# Patient Record
Sex: Female | Born: 1950 | Race: White | Hispanic: No | Marital: Married | Smoking: Current every day smoker
Health system: Southern US, Community
[De-identification: ages and names within clinical notes are randomized; demographics above are authoritative.]

## PROBLEM LIST (undated history)

## (undated) DIAGNOSIS — E119 Type 2 diabetes mellitus without complications: Secondary | ICD-10-CM

## (undated) DIAGNOSIS — J449 Chronic obstructive pulmonary disease, unspecified: Secondary | ICD-10-CM

## (undated) DIAGNOSIS — I10 Essential (primary) hypertension: Secondary | ICD-10-CM

---

## 2016-02-01 DIAGNOSIS — D472 Monoclonal gammopathy: Secondary | ICD-10-CM

## 2016-05-08 DIAGNOSIS — D472 Monoclonal gammopathy: Secondary | ICD-10-CM

## 2016-05-08 DIAGNOSIS — Z803 Family history of malignant neoplasm of breast: Secondary | ICD-10-CM

## 2016-05-08 DIAGNOSIS — F17210 Nicotine dependence, cigarettes, uncomplicated: Secondary | ICD-10-CM

## 2016-11-07 DIAGNOSIS — D472 Monoclonal gammopathy: Secondary | ICD-10-CM | POA: Diagnosis not present

## 2016-11-07 DIAGNOSIS — F17219 Nicotine dependence, cigarettes, with unspecified nicotine-induced disorders: Secondary | ICD-10-CM | POA: Diagnosis not present

## 2016-11-07 DIAGNOSIS — Z803 Family history of malignant neoplasm of breast: Secondary | ICD-10-CM

## 2017-05-12 DIAGNOSIS — D472 Monoclonal gammopathy: Secondary | ICD-10-CM | POA: Diagnosis not present

## 2017-05-12 DIAGNOSIS — H6693 Otitis media, unspecified, bilateral: Secondary | ICD-10-CM | POA: Diagnosis not present

## 2017-05-12 DIAGNOSIS — Z803 Family history of malignant neoplasm of breast: Secondary | ICD-10-CM | POA: Diagnosis not present

## 2018-04-20 DIAGNOSIS — R590 Localized enlarged lymph nodes: Secondary | ICD-10-CM

## 2018-04-20 DIAGNOSIS — I10 Essential (primary) hypertension: Secondary | ICD-10-CM

## 2018-04-20 DIAGNOSIS — R0602 Shortness of breath: Secondary | ICD-10-CM | POA: Diagnosis not present

## 2018-04-20 DIAGNOSIS — R55 Syncope and collapse: Secondary | ICD-10-CM

## 2018-04-20 DIAGNOSIS — R918 Other nonspecific abnormal finding of lung field: Secondary | ICD-10-CM

## 2018-04-20 DIAGNOSIS — R441 Visual hallucinations: Secondary | ICD-10-CM | POA: Diagnosis not present

## 2018-04-20 DIAGNOSIS — E876 Hypokalemia: Secondary | ICD-10-CM

## 2018-04-20 DIAGNOSIS — N179 Acute kidney failure, unspecified: Secondary | ICD-10-CM

## 2018-04-21 ENCOUNTER — Other Ambulatory Visit: Payer: Self-pay

## 2018-04-21 ENCOUNTER — Encounter (HOSPITAL_COMMUNITY): Payer: Self-pay | Admitting: Family Medicine

## 2018-04-21 ENCOUNTER — Inpatient Hospital Stay (HOSPITAL_COMMUNITY)
Admission: AD | Admit: 2018-04-21 | Discharge: 2018-04-23 | DRG: 180 | Disposition: A | Discharge status: Home / Self Care | Payer: Medicare Other | Source: Other Acute Inpatient Hospital | Attending: Family Medicine | Admitting: Family Medicine

## 2018-04-21 DIAGNOSIS — E119 Type 2 diabetes mellitus without complications: Secondary | ICD-10-CM | POA: Diagnosis not present

## 2018-04-21 DIAGNOSIS — C3411 Malignant neoplasm of upper lobe, right bronchus or lung: Secondary | ICD-10-CM | POA: Diagnosis present

## 2018-04-21 DIAGNOSIS — Z7984 Long term (current) use of oral hypoglycemic drugs: Secondary | ICD-10-CM

## 2018-04-21 DIAGNOSIS — F419 Anxiety disorder, unspecified: Secondary | ICD-10-CM | POA: Diagnosis present

## 2018-04-21 DIAGNOSIS — E1151 Type 2 diabetes mellitus with diabetic peripheral angiopathy without gangrene: Secondary | ICD-10-CM | POA: Diagnosis present

## 2018-04-21 DIAGNOSIS — E876 Hypokalemia: Secondary | ICD-10-CM | POA: Diagnosis present

## 2018-04-21 DIAGNOSIS — E279 Disorder of adrenal gland, unspecified: Secondary | ICD-10-CM

## 2018-04-21 DIAGNOSIS — R590 Localized enlarged lymph nodes: Secondary | ICD-10-CM | POA: Diagnosis not present

## 2018-04-21 DIAGNOSIS — F17210 Nicotine dependence, cigarettes, uncomplicated: Secondary | ICD-10-CM | POA: Diagnosis present

## 2018-04-21 DIAGNOSIS — Z803 Family history of malignant neoplasm of breast: Secondary | ICD-10-CM

## 2018-04-21 DIAGNOSIS — N179 Acute kidney failure, unspecified: Secondary | ICD-10-CM | POA: Diagnosis present

## 2018-04-21 DIAGNOSIS — I129 Hypertensive chronic kidney disease with stage 1 through stage 4 chronic kidney disease, or unspecified chronic kidney disease: Secondary | ICD-10-CM | POA: Diagnosis present

## 2018-04-21 DIAGNOSIS — Z7982 Long term (current) use of aspirin: Secondary | ICD-10-CM

## 2018-04-21 DIAGNOSIS — R441 Visual hallucinations: Secondary | ICD-10-CM | POA: Diagnosis not present

## 2018-04-21 DIAGNOSIS — R0602 Shortness of breath: Secondary | ICD-10-CM | POA: Diagnosis not present

## 2018-04-21 DIAGNOSIS — E1122 Type 2 diabetes mellitus with diabetic chronic kidney disease: Secondary | ICD-10-CM | POA: Diagnosis present

## 2018-04-21 DIAGNOSIS — Z79899 Other long term (current) drug therapy: Secondary | ICD-10-CM | POA: Diagnosis not present

## 2018-04-21 DIAGNOSIS — Z91030 Bee allergy status: Secondary | ICD-10-CM | POA: Diagnosis not present

## 2018-04-21 DIAGNOSIS — N183 Chronic kidney disease, stage 3 (moderate): Secondary | ICD-10-CM | POA: Diagnosis present

## 2018-04-21 DIAGNOSIS — C3491 Malignant neoplasm of unspecified part of right bronchus or lung: Secondary | ICD-10-CM | POA: Diagnosis present

## 2018-04-21 DIAGNOSIS — F329 Major depressive disorder, single episode, unspecified: Secondary | ICD-10-CM | POA: Diagnosis present

## 2018-04-21 DIAGNOSIS — I10 Essential (primary) hypertension: Secondary | ICD-10-CM | POA: Diagnosis present

## 2018-04-21 DIAGNOSIS — Z800 Family history of malignant neoplasm of digestive organs: Secondary | ICD-10-CM

## 2018-04-21 DIAGNOSIS — J9601 Acute respiratory failure with hypoxia: Secondary | ICD-10-CM | POA: Diagnosis present

## 2018-04-21 DIAGNOSIS — Z880 Allergy status to penicillin: Secondary | ICD-10-CM

## 2018-04-21 DIAGNOSIS — J441 Chronic obstructive pulmonary disease with (acute) exacerbation: Secondary | ICD-10-CM | POA: Diagnosis present

## 2018-04-21 DIAGNOSIS — R918 Other nonspecific abnormal finding of lung field: Secondary | ICD-10-CM | POA: Diagnosis present

## 2018-04-21 DIAGNOSIS — E278 Other specified disorders of adrenal gland: Secondary | ICD-10-CM | POA: Diagnosis present

## 2018-04-21 DIAGNOSIS — R55 Syncope and collapse: Secondary | ICD-10-CM | POA: Diagnosis not present

## 2018-04-21 DIAGNOSIS — Z716 Tobacco abuse counseling: Secondary | ICD-10-CM

## 2018-04-21 HISTORY — DX: Essential (primary) hypertension: I10

## 2018-04-21 HISTORY — DX: Type 2 diabetes mellitus without complications: E11.9

## 2018-04-21 HISTORY — DX: Chronic obstructive pulmonary disease, unspecified: J44.9

## 2018-04-21 LAB — COMPREHENSIVE METABOLIC PANEL
ALBUMIN: 3.4 g/dL — AB (ref 3.5–5.0)
ALK PHOS: 56 U/L (ref 38–126)
ALT: 19 U/L (ref 0–44)
ANION GAP: 11 (ref 5–15)
AST: 15 U/L (ref 15–41)
BILIRUBIN TOTAL: 0.5 mg/dL (ref 0.3–1.2)
BUN: 23 mg/dL (ref 8–23)
CALCIUM: 10.4 mg/dL — AB (ref 8.9–10.3)
CO2: 22 mmol/L (ref 22–32)
Chloride: 108 mmol/L (ref 98–111)
Creatinine, Ser: 1.07 mg/dL — ABNORMAL HIGH (ref 0.44–1.00)
GFR calc non Af Amer: 52 mL/min — ABNORMAL LOW (ref >60)
GLUCOSE: 112 mg/dL — AB (ref 70–99)
POTASSIUM: 4.6 mmol/L (ref 3.5–5.1)
SODIUM: 141 mmol/L (ref 135–145)
TOTAL PROTEIN: 8.1 g/dL (ref 6.5–8.1)

## 2018-04-21 LAB — CBC WITH DIFFERENTIAL/PLATELET
Abs Immature Granulocytes: 0.1 10*3/uL (ref 0.0–0.1)
BASOS ABS: 0 10*3/uL (ref 0.0–0.1)
BASOS PCT: 0 %
Eosinophils Absolute: 0 10*3/uL (ref 0.0–0.7)
Eosinophils Relative: 0 %
HCT: 39.6 % (ref 36.0–46.0)
HEMOGLOBIN: 12.4 g/dL (ref 12.0–15.0)
IMMATURE GRANULOCYTES: 1 %
LYMPHS PCT: 9 %
Lymphs Abs: 0.9 10*3/uL (ref 0.7–4.0)
MCH: 27.2 pg (ref 26.0–34.0)
MCHC: 31.3 g/dL (ref 30.0–36.0)
MCV: 86.8 fL (ref 78.0–100.0)
MONO ABS: 0.8 10*3/uL (ref 0.1–1.0)
Monocytes Relative: 9 %
NEUTROS PCT: 81 %
Neutro Abs: 7.7 10*3/uL (ref 1.7–7.7)
PLATELETS: 324 10*3/uL (ref 150–400)
RBC: 4.56 MIL/uL (ref 3.87–5.11)
RDW: 15.5 % (ref 11.5–15.5)
WBC: 9.4 10*3/uL (ref 4.0–10.5)

## 2018-04-21 LAB — GLUCOSE, CAPILLARY
GLUCOSE-CAPILLARY: 100 mg/dL — AB (ref 70–99)
Glucose-Capillary: 115 mg/dL — ABNORMAL HIGH (ref 70–99)

## 2018-04-21 MED ORDER — ROSUVASTATIN CALCIUM 10 MG PO TABS
40.0000 mg | ORAL_TABLET | Freq: Every day | ORAL | Status: DC
  Administered 2018-04-21 – 2018-04-22 (×2): 40 mg via ORAL
  Filled 2018-04-21 (×2): qty 4

## 2018-04-21 MED ORDER — ALBUTEROL SULFATE (2.5 MG/3ML) 0.083% IN NEBU: 2.5000 mg | INHALATION_SOLUTION | RESPIRATORY_TRACT | Status: DC | PRN

## 2018-04-21 MED ORDER — ONDANSETRON HCL 4 MG PO TABS: 4.0000 mg | ORAL_TABLET | Freq: Four times a day (QID) | ORAL | Status: DC | PRN

## 2018-04-21 MED ORDER — MIRTAZAPINE 15 MG PO TABS
15.0000 mg | ORAL_TABLET | Freq: Every day | ORAL | Status: DC
  Administered 2018-04-21 – 2018-04-22 (×2): 15 mg via ORAL
  Filled 2018-04-21 (×2): qty 1

## 2018-04-21 MED ORDER — IPRATROPIUM-ALBUTEROL 0.5-2.5 (3) MG/3ML IN SOLN
3.0000 mL | Freq: Three times a day (TID) | RESPIRATORY_TRACT | Status: DC
  Administered 2018-04-21 – 2018-04-23 (×5): 3 mL via RESPIRATORY_TRACT
  Filled 2018-04-21 (×5): qty 3

## 2018-04-21 MED ORDER — HYDRALAZINE HCL 20 MG/ML IJ SOLN: 10.0000 mg | INTRAMUSCULAR | Status: DC | PRN

## 2018-04-21 MED ORDER — SODIUM CHLORIDE 0.9% FLUSH
3.0000 mL | Freq: Two times a day (BID) | INTRAVENOUS | Status: DC
  Administered 2018-04-21 – 2018-04-23 (×4): 3 mL via INTRAVENOUS

## 2018-04-21 MED ORDER — PREDNISONE 20 MG PO TABS
50.0000 mg | ORAL_TABLET | Freq: Every day | ORAL | Status: DC
  Administered 2018-04-22 – 2018-04-23 (×2): 50 mg via ORAL
  Filled 2018-04-21 (×2): qty 2

## 2018-04-21 MED ORDER — SODIUM CHLORIDE 0.9 % IV SOLN
INTRAVENOUS | Status: AC
  Administered 2018-04-21: 22:00:00 via INTRAVENOUS

## 2018-04-21 MED ORDER — ACETAMINOPHEN 325 MG PO TABS: 650.0000 mg | ORAL_TABLET | Freq: Four times a day (QID) | ORAL | Status: DC | PRN

## 2018-04-21 MED ORDER — INSULIN ASPART 100 UNIT/ML ~~LOC~~ SOLN
0.0000 [IU] | SUBCUTANEOUS | Status: DC
  Administered 2018-04-22 – 2018-04-23 (×4): 2 [IU] via SUBCUTANEOUS

## 2018-04-21 MED ORDER — MOMETASONE FURO-FORMOTEROL FUM 100-5 MCG/ACT IN AERO
2.0000 | INHALATION_SPRAY | Freq: Two times a day (BID) | RESPIRATORY_TRACT | Status: DC
  Administered 2018-04-22 – 2018-04-23 (×3): 2 via RESPIRATORY_TRACT
  Filled 2018-04-21: qty 8.8

## 2018-04-21 MED ORDER — BISACODYL 5 MG PO TBEC: 5.0000 mg | DELAYED_RELEASE_TABLET | Freq: Every day | ORAL | Status: DC | PRN

## 2018-04-21 MED ORDER — SENNOSIDES-DOCUSATE SODIUM 8.6-50 MG PO TABS: 1.0000 | ORAL_TABLET | Freq: Every evening | ORAL | Status: DC | PRN

## 2018-04-21 MED ORDER — HYDROCODONE-ACETAMINOPHEN 5-325 MG PO TABS: 1.0000 | ORAL_TABLET | ORAL | Status: DC | PRN

## 2018-04-21 MED ORDER — LEVOFLOXACIN IN D5W 750 MG/150ML IV SOLN
750.0000 mg | INTRAVENOUS | Status: DC
  Administered 2018-04-22 – 2018-04-23 (×2): 750 mg via INTRAVENOUS
  Filled 2018-04-21 (×2): qty 150

## 2018-04-21 MED ORDER — METOPROLOL SUCCINATE ER 50 MG PO TB24
50.0000 mg | ORAL_TABLET | Freq: Every day | ORAL | Status: DC
  Administered 2018-04-22 – 2018-04-23 (×2): 50 mg via ORAL
  Filled 2018-04-21 (×2): qty 1

## 2018-04-21 MED ORDER — GUAIFENESIN ER 600 MG PO TB12
600.0000 mg | ORAL_TABLET | Freq: Two times a day (BID) | ORAL | Status: DC | PRN
  Administered 2018-04-22: 600 mg via ORAL
  Filled 2018-04-21: qty 1

## 2018-04-21 MED ORDER — ACETAMINOPHEN 650 MG RE SUPP: 650.0000 mg | Freq: Four times a day (QID) | RECTAL | Status: DC | PRN

## 2018-04-21 MED ORDER — GABAPENTIN 300 MG PO CAPS
300.0000 mg | ORAL_CAPSULE | Freq: Every day | ORAL | Status: DC
  Administered 2018-04-21 – 2018-04-22 (×2): 300 mg via ORAL
  Filled 2018-04-21 (×2): qty 1

## 2018-04-21 MED ORDER — ONDANSETRON HCL 4 MG/2ML IJ SOLN: 4.0000 mg | Freq: Four times a day (QID) | INTRAMUSCULAR | Status: DC | PRN

## 2018-04-21 MED ORDER — CITALOPRAM HYDROBROMIDE 20 MG PO TABS
40.0000 mg | ORAL_TABLET | Freq: Every evening | ORAL | Status: DC
  Administered 2018-04-21 – 2018-04-22 (×2): 40 mg via ORAL
  Filled 2018-04-21 (×2): qty 2

## 2018-04-21 NOTE — Progress Notes (Addendum)
Pharmacy Antibiotic Note  Alicia Morse is a 67 y.o. female admitted on 04/21/2018 with a right lung mass with plans to consult CT surgery for VATS. Also with concern for CAP and pharmacy has been consulted for Levaquin dosing.  The patient is noted to have anaphylaxis with penicillins and there is no record of cephalosporin use in our system. It is documented that the patient received a dose of Levaquin at [facility] however there is no MAR in the paper records to indicate when this dose was given. It is presumed that the dose was likely given within the first few hours of arrival to [place] so will estimate a time between 0900-1200.   Plan: - CMP was ordered upon arrival to [facility] and remains pending - Will check out to 3rd shift to follow-up on this result for determination of Levaquin dose moving forward.    Temp (24hrs), Avg:97.6 F (36.4 C), Min:97.6 F (36.4 C), Max:97.6 F (36.4 C)  Recent Labs  Lab 04/21/18 2153  WBC 9.4  CREATININE 1.07*       Allergies  Allergen Reactions  . Penicillins Anaphylaxis    Antimicrobials this admission: LVQ 9/24 >>    Thank you for allowing pharmacy to be a part of this patient's care.  Alycia Rossetti, PharmD, BCPS Clinical Pharmacist Pager: [phone] Please check AMION for all [place] numbers 04/21/2018 10:27 PM     Addendum: SCr 1.07.  Spoke w/ [facility] pharmacist who reports that pt rec'd Levaquin 750mg  IV 9/23 ~1230 then Levaquin 750mg  PO 9/24 ~1230.  Will continue with Levaquin 750mg  IV Q24H. Wynona Neat, PharmD, BCPS 04/21/2018 11:37 PM

## 2018-04-21 NOTE — H&P (Signed)
History and Physical    Alicia Morse VZD:[id number] DOB: 1951-05-18 DOA: 04/21/2018  PCP: [name]., MD   Patient coming from: Home, by way of [facility]   Chief Complaint: SOB, productive cough  HPI: Alicia Morse is a 67 y.o. female with medical history significant for COPD, type 2 diabetes mellitus, hypertension, and anxiety, who presented to the emergency department for evaluation of progressive shortness of breath and productive cough despite completing a course of azithromycin last week.  Patient reports a chronic mild cough that worsened a few weeks ago and became productive of thick sputum.  She reports some occasional chills and reports a long history of night sweats, but denies recent weight loss.  She previously smoked 2 packs/day but has more recently cut back to ~1/4 ppd.  She denies chest pain or hemoptysis.  She had one episode of posttussive vomiting, but no hematemesis and no diarrhea.  [facility] ED & [facility] Course: Upon arrival to the ED, patient is found to be afebrile with cough and wheeze, saturating 80% on room air.  Creatinine was elevated at 1.60, up from her reported baseline of 1.0.  D-dimer was elevated.  CTA chest was negative for PE, but concerning for right upper lobe mass with thoracic LAD and possible adrenal mets.  She was treated with steroids, Levaquin, and nebs.  She had a syncopal episode in the setting of a coughing fit and quickly recovered.  There was no associated chest pain or palpitations.  Oncology recommended biopsy ASAP.  IR recommended consulting cardiothoracic surgery to see.  [facility] surgery at [facility] was consulted and agreed with transfer and the patient here with plan for VATS on 04/22/18.    Review of Systems:  All other systems reviewed and apart from HPI, are negative.  Past Medical History:  Diagnosis Date  . COPD (chronic obstructive pulmonary disease) ([place])   . Diabetes mellitus without complication ([place])   .  Hypertension     History reviewed. No pertinent surgical history.   reports that she has been smoking. She has never used smokeless tobacco. She reports that she drank alcohol. She reports that she does not use drugs.  Allergies  Allergen Reactions  . Penicillins Anaphylaxis    Family History  Problem Relation Age of Onset  . Breast cancer Mother   . Colon cancer Father      Prior to Admission medications   Medication Sig Start Date End Date Taking? Authorizing Provider  aspirin 325 MG tablet Take 325 mg by mouth daily.    [provider]  citalopram (CELEXA) 40 MG tablet Take 40 mg by mouth every evening. 02/04/18   [provider]  gabapentin (NEURONTIN) 300 MG capsule Take 300 mg by mouth at bedtime.    [provider]  losartan (COZAAR) 100 MG tablet Take 100 mg by mouth daily. 02/04/18   [provider]  metFORMIN (GLUCOPHAGE-XR) 500 MG 24 hr tablet Take 500 mg by mouth 2 (two) times daily. 03/15/18   [provider]  metoprolol succinate (TOPROL-XL) 50 MG 24 hr tablet Take 50 mg by mouth daily. 02/11/18   [provider]  mirtazapine (REMERON) 15 MG tablet Take 15 mg by mouth at bedtime.    [provider]  rosuvastatin (CRESTOR) 40 MG tablet Take 40 mg by mouth every evening. 02/04/18   [provider]  senna (SENOKOT) 8.6 MG tablet Take 3 tablets by mouth every evening.    [provider]  triamterene-hydrochlorothiazide (DYAZIDE) 37.5-25 MG capsule Take 1 capsule by mouth daily. 02/27/18   [provider]    Physical Exam: Vitals:   04/21/18 2023  BP: (!) 183/91  Pulse: 86  Resp: (!) 21  Temp: 97.6 F (36.4 C)  TempSrc: Oral  SpO2: 99%     Constitutional: NAD, calm  Eyes: PERTLA, lids and conjunctivae normal ENMT: Mucous membranes are moist. Posterior pharynx clear of any exudate or lesions.   Neck: normal, supple, no masses, no thyromegaly Respiratory: Diminished breath  sounds bilaterally. Mild dyspnea with speech. No accessory muscle use.  Cardiovascular: S1 & S2 heard, regular rate and rhythm. No extremity edema.   Abdomen: No distension, no tenderness, soft. Bowel sounds normal.  Musculoskeletal: no clubbing / cyanosis. Status-post BKA.    Skin: no significant rashes, lesions, ulcers. Warm, dry, well-perfused. Neurologic: CN 2-12 grossly intact. Sensation intact. Strength 5/5 in all 4 limbs.  Psychiatric: Alert and oriented x 3. Pleasant and cooperative.    Labs on Admission: I have personally reviewed following labs and imaging studies  CBC: No results for input(s): WBC, NEUTROABS, HGB, HCT, MCV, PLT in the last 168 hours. Basic Metabolic Panel: No results for input(s): NA, K, CL, CO2, GLUCOSE, BUN, CREATININE, CALCIUM, MG, PHOS in the last 168 hours. GFR: CrCl cannot be calculated (No successful lab value found.). Liver Function Tests: No results for input(s): AST, ALT, ALKPHOS, BILITOT, PROT, ALBUMIN in the last 168 hours. No results for input(s): LIPASE, AMYLASE in the last 168 hours. No results for input(s): AMMONIA in the last 168 hours. Coagulation Profile: No results for input(s): INR, PROTIME in the last 168 hours. Cardiac Enzymes: No results for input(s): CKTOTAL, CKMB, CKMBINDEX, TROPONINI in the last 168 hours. BNP (last 3 results) No results for input(s): PROBNP in the last 8760 hours. HbA1C: No results for input(s): HGBA1C in the last 72 hours. CBG: No results for input(s): GLUCAP in the last 168 hours. Lipid Profile: No results for input(s): CHOL, HDL, LDLCALC, TRIG, CHOLHDL, LDLDIRECT in the last 72 hours. Thyroid Function Tests: No results for input(s): TSH, T4TOTAL, FREET4, T3FREE, THYROIDAB in the last 72 hours. Anemia Panel: No results for input(s): VITAMINB12, FOLATE, FERRITIN, TIBC, IRON, RETICCTPCT in the last 72 hours. Urine analysis: No results found for: COLORURINE, APPEARANCEUR, LABSPEC, PHURINE, GLUCOSEU, HGBUR,  BILIRUBINUR, KETONESUR, PROTEINUR, UROBILINOGEN, NITRITE, LEUKOCYTESUR Sepsis Labs: @LABRCNTIP (procalcitonin:4,lacticidven:4) )No results found for this or any previous visit (from the past 240 hour(s)).   Radiological Exams on Admission: No results found.  EKG: Not performed.   Assessment/Plan   1. Right lung mass - Presents with progressive SOB and productive cough despite completing a course of azithromycin last week  - CTA was negative for PE but concerning for right lung mass with adenopathy and adrenal masses  - Oncology was consulted from [facility] and recommended obtaining tissue sample ASAP  - CT surgery is consulting and much appreciated, planning tentatively for VATS on 04/22/18  - Keep NPO after midnight, continue supportive care    2. COPD with acute exacerbation  - Presents with SOB and productive cough that worsened despite recent treatment with azithromycin  - She was started on prednisone and Levaquin at [facility] and nebs were continued  - Continue ICS/LABA, prednisone, nebs, Levaquin, as-needed supplemental O2    3. AKI  - SCr was 1.6 at [facility], up from reported baseline of 1.0  - She was treated with IVF  - Hold ARB and diuretics, renally-dose medications, repeat chem  panel   4. Syncope  - Patient had a syncopal episode on 04/21/18  - This was in the setting of a coughing fit and likely vasovagal  - Continue cardiac monitoring for now    5. Hypertension  - Hold diuretics and ARB in light of AKI  - Continue Toprol as tolerated    6. Type II DM  - A1c was 6.6% this month  - Managed with metformin at home, held on admission  - Check CBG's and use a low-intensity SSI with Novolog while in hospital   7. Anxiety  - Cotinue Celexa and Remeron    8. Hypokalemia  - Serum potassium was 3.3 and treated with 40 mEq oral potassium  - Repeat chem panel     DVT prophylaxis: SCD's  Code Status: Full  Family Communication: Discussed with patient    Consults called: CTS Admission status: Inpatient     Vianne Bulls, MD [facility] Pager [phone]  If 7PM-7AM, please contact night-coverage [email or web address] Password [facility]  04/21/2018, 9:26 PM

## 2018-04-22 ENCOUNTER — Encounter (HOSPITAL_COMMUNITY)
Admission: AD | Disposition: A | Discharge status: Home / Self Care | Payer: Self-pay | Source: Other Acute Inpatient Hospital | Attending: Family Medicine

## 2018-04-22 ENCOUNTER — Inpatient Hospital Stay (HOSPITAL_COMMUNITY): Payer: Medicare Other | Admitting: Anesthesiology

## 2018-04-22 ENCOUNTER — Inpatient Hospital Stay (HOSPITAL_COMMUNITY): Payer: Medicare Other

## 2018-04-22 ENCOUNTER — Encounter (HOSPITAL_COMMUNITY): Payer: Self-pay | Admitting: Anesthesiology

## 2018-04-22 ENCOUNTER — Ambulatory Visit (HOSPITAL_COMMUNITY): Admission: RE | Admit: 2018-04-22 | Payer: Medicare Other | Source: Ambulatory Visit | Admitting: Cardiothoracic Surgery

## 2018-04-22 DIAGNOSIS — R590 Localized enlarged lymph nodes: Secondary | ICD-10-CM

## 2018-04-22 DIAGNOSIS — I10 Essential (primary) hypertension: Secondary | ICD-10-CM

## 2018-04-22 DIAGNOSIS — R918 Other nonspecific abnormal finding of lung field: Secondary | ICD-10-CM

## 2018-04-22 DIAGNOSIS — E119 Type 2 diabetes mellitus without complications: Secondary | ICD-10-CM

## 2018-04-22 HISTORY — PX: VIDEO BRONCHOSCOPY WITH ENDOBRONCHIAL ULTRASOUND: SHX6177

## 2018-04-22 LAB — GLUCOSE, CAPILLARY
GLUCOSE-CAPILLARY: 114 mg/dL — AB (ref 70–99)
GLUCOSE-CAPILLARY: 142 mg/dL — AB (ref 70–99)
GLUCOSE-CAPILLARY: 162 mg/dL — AB (ref 70–99)
GLUCOSE-CAPILLARY: 200 mg/dL — AB (ref 70–99)
GLUCOSE-CAPILLARY: 95 mg/dL (ref 70–99)
Glucose-Capillary: 167 mg/dL — ABNORMAL HIGH (ref 70–99)
Glucose-Capillary: 196 mg/dL — ABNORMAL HIGH (ref 70–99)

## 2018-04-22 LAB — COMPREHENSIVE METABOLIC PANEL
ALBUMIN: 3.3 g/dL — AB (ref 3.5–5.0)
ALK PHOS: 51 U/L (ref 38–126)
ALT: 17 U/L (ref 0–44)
ANION GAP: 14 (ref 5–15)
AST: 16 U/L (ref 15–41)
BILIRUBIN TOTAL: 0.3 mg/dL (ref 0.3–1.2)
BUN: 19 mg/dL (ref 8–23)
CALCIUM: 10 mg/dL (ref 8.9–10.3)
CO2: 19 mmol/L — ABNORMAL LOW (ref 22–32)
CREATININE: 0.99 mg/dL (ref 0.44–1.00)
Chloride: 107 mmol/L (ref 98–111)
GFR calc Af Amer: >60 mL/min (ref >60)
GFR calc non Af Amer: 58 mL/min — ABNORMAL LOW (ref >60)
GLUCOSE: 184 mg/dL — AB (ref 70–99)
Potassium: 3.8 mmol/L (ref 3.5–5.1)
Sodium: 140 mmol/L (ref 135–145)
TOTAL PROTEIN: 7.9 g/dL (ref 6.5–8.1)

## 2018-04-22 LAB — TYPE AND SCREEN
ABO/RH(D): A NEG
Antibody Screen: NEGATIVE

## 2018-04-22 LAB — CBC
HCT: 41.7 % (ref 36.0–46.0)
Hemoglobin: 13.3 g/dL (ref 12.0–15.0)
MCH: 27.3 pg (ref 26.0–34.0)
MCHC: 31.9 g/dL (ref 30.0–36.0)
MCV: 85.6 fL (ref 78.0–100.0)
PLATELETS: 317 10*3/uL (ref 150–400)
RBC: 4.87 MIL/uL (ref 3.87–5.11)
RDW: 15.6 % — AB (ref 11.5–15.5)
WBC: 10.2 10*3/uL (ref 4.0–10.5)

## 2018-04-22 LAB — ABO/RH: ABO/RH(D): A NEG

## 2018-04-22 LAB — MRSA PCR SCREENING: MRSA by PCR: NEGATIVE

## 2018-04-22 LAB — PROTIME-INR
INR: 1.19
PROTHROMBIN TIME: 15 s (ref 11.4–15.2)

## 2018-04-22 LAB — APTT: aPTT: 31 seconds (ref 24–36)

## 2018-04-22 LAB — HIV ANTIBODY (ROUTINE TESTING W REFLEX): HIV SCREEN 4TH GENERATION: NONREACTIVE

## 2018-04-22 SURGERY — BRONCHOSCOPY, WITH EBUS
Anesthesia: General | Site: Bronchus

## 2018-04-22 MED ORDER — ONDANSETRON HCL 4 MG/2ML IJ SOLN
INTRAMUSCULAR | Status: DC | PRN
  Administered 2018-04-22: 4 mg via INTRAVENOUS

## 2018-04-22 MED ORDER — HYDROXYZINE HCL 25 MG PO TABS
25.0000 mg | ORAL_TABLET | Freq: Once | ORAL | Status: AC
  Administered 2018-04-22: 25 mg via ORAL
  Filled 2018-04-22: qty 1

## 2018-04-22 MED ORDER — DEXAMETHASONE SODIUM PHOSPHATE 10 MG/ML IJ SOLN
INTRAMUSCULAR | Status: AC
  Filled 2018-04-22: qty 1

## 2018-04-22 MED ORDER — PROMETHAZINE HCL 25 MG/ML IJ SOLN: 6.2500 mg | INTRAMUSCULAR | Status: DC | PRN

## 2018-04-22 MED ORDER — SODIUM CHLORIDE 0.9 % IV SOLN: INTRAVENOUS | Status: DC

## 2018-04-22 MED ORDER — LACTATED RINGERS IV SOLN
INTRAVENOUS | Status: DC
  Administered 2018-04-22 – 2018-04-23 (×2): via INTRAVENOUS

## 2018-04-22 MED ORDER — MIDAZOLAM HCL 5 MG/5ML IJ SOLN
INTRAMUSCULAR | Status: DC | PRN
  Administered 2018-04-22: 2 mg via INTRAVENOUS

## 2018-04-22 MED ORDER — SUGAMMADEX SODIUM 200 MG/2ML IV SOLN
INTRAVENOUS | Status: DC | PRN
  Administered 2018-04-22: 200 mg via INTRAVENOUS

## 2018-04-22 MED ORDER — ROCURONIUM BROMIDE 10 MG/ML (PF) SYRINGE
PREFILLED_SYRINGE | INTRAVENOUS | Status: DC | PRN
  Administered 2018-04-22: 50 mg via INTRAVENOUS

## 2018-04-22 MED ORDER — TRAZODONE HCL 100 MG PO TABS
100.0000 mg | ORAL_TABLET | Freq: Every day | ORAL | Status: DC
  Administered 2018-04-22: 100 mg via ORAL
  Filled 2018-04-22: qty 1

## 2018-04-22 MED ORDER — DEXAMETHASONE SODIUM PHOSPHATE 10 MG/ML IJ SOLN
INTRAMUSCULAR | Status: DC | PRN
  Administered 2018-04-22: 5 mg via INTRAVENOUS

## 2018-04-22 MED ORDER — ALPRAZOLAM 0.5 MG PO TABS
0.5000 mg | ORAL_TABLET | Freq: Three times a day (TID) | ORAL | Status: DC | PRN
  Administered 2018-04-22: 0.5 mg via ORAL
  Filled 2018-04-22: qty 1

## 2018-04-22 MED ORDER — EPINEPHRINE PF 1 MG/ML IJ SOLN
INTRAMUSCULAR | Status: DC | PRN
  Administered 2018-04-22: 1 mg

## 2018-04-22 MED ORDER — CHLORHEXIDINE GLUCONATE CLOTH 2 % EX PADS: 6.0000 | MEDICATED_PAD | Freq: Every day | CUTANEOUS | Status: DC

## 2018-04-22 MED ORDER — PROPOFOL 10 MG/ML IV BOLUS
INTRAVENOUS | Status: DC | PRN
  Administered 2018-04-22: 150 mg via INTRAVENOUS

## 2018-04-22 MED ORDER — MUPIROCIN 2 % EX OINT: 1.0000 "application " | TOPICAL_OINTMENT | Freq: Two times a day (BID) | CUTANEOUS | Status: DC

## 2018-04-22 MED ORDER — INSULIN ASPART 100 UNIT/ML ~~LOC~~ SOLN
SUBCUTANEOUS | Status: AC
  Filled 2018-04-22: qty 1

## 2018-04-22 MED ORDER — INSULIN ASPART 100 UNIT/ML ~~LOC~~ SOLN
2.0000 [IU] | Freq: Once | SUBCUTANEOUS | Status: AC
  Administered 2018-04-22: 2 [IU] via SUBCUTANEOUS

## 2018-04-22 MED ORDER — CHLORHEXIDINE GLUCONATE CLOTH 2 % EX PADS
6.0000 | MEDICATED_PAD | Freq: Once | CUTANEOUS | Status: AC
  Administered 2018-04-22: 6 via TOPICAL

## 2018-04-22 MED ORDER — HYDROMORPHONE HCL 1 MG/ML IJ SOLN: 0.2500 mg | INTRAMUSCULAR | Status: DC | PRN

## 2018-04-22 MED ORDER — LIDOCAINE 2% (20 MG/ML) 5 ML SYRINGE
INTRAMUSCULAR | Status: DC | PRN
  Administered 2018-04-22: 80 mg via INTRAVENOUS

## 2018-04-22 MED ORDER — GUAIFENESIN-DM 100-10 MG/5ML PO SYRP: 5.0000 mL | ORAL_SOLUTION | ORAL | Status: DC | PRN

## 2018-04-22 MED ORDER — FENTANYL CITRATE (PF) 250 MCG/5ML IJ SOLN
INTRAMUSCULAR | Status: DC | PRN
  Administered 2018-04-22: 100 ug via INTRAVENOUS

## 2018-04-22 MED ORDER — VANCOMYCIN HCL IN DEXTROSE 1-5 GM/200ML-% IV SOLN
1000.0000 mg | INTRAVENOUS | Status: AC
  Administered 2018-04-22: 1000 mg via INTRAVENOUS

## 2018-04-22 SURGICAL SUPPLY — 41 items
ADAPTER VALVE BIOPSY EBUS (MISCELLANEOUS) ×2 IMPLANT
ADPTR VALVE BIOPSY EBUS (MISCELLANEOUS) ×1
BRUSH CYTOL CELLEBRITY 1.5X140 (MISCELLANEOUS) ×3 IMPLANT
BRUSH CYTOL CELLEBRITY 1.9X150 (MISCELLANEOUS) ×3 IMPLANT
CANISTER SUCT 3000ML PPV (MISCELLANEOUS) ×6 IMPLANT
CONT SPEC 4OZ CLIKSEAL STRL BL (MISCELLANEOUS) ×3 IMPLANT
COVER BACK TABLE 60X90IN (DRAPES) ×3 IMPLANT
DERMABOND ADVANCED (GAUZE/BANDAGES/DRESSINGS) ×1
DERMABOND ADVANCED .7 DNX12 (GAUZE/BANDAGES/DRESSINGS) ×2 IMPLANT
DRAPE LAPAROTOMY T 102X78X121 (DRAPES) IMPLANT
DRSG AQUACEL AG ADV 3.5X14 (GAUZE/BANDAGES/DRESSINGS) IMPLANT
FORCEPS BIOP RJ4 1.8 (CUTTING FORCEPS) ×3 IMPLANT
GAUZE 4X4 16PLY RFD (DISPOSABLE) ×3 IMPLANT
GAUZE SPONGE 4X4 12PLY STRL (GAUZE/BANDAGES/DRESSINGS) ×6 IMPLANT
GLOVE BIO SURGEON STRL SZ 6.5 (GLOVE) ×6 IMPLANT
GOWN STRL REUS W/ TWL LRG LVL3 (GOWN DISPOSABLE) ×4 IMPLANT
GOWN STRL REUS W/TWL LRG LVL3 (GOWN DISPOSABLE) ×2
HEMOSTAT SURGICEL 2X14 (HEMOSTASIS) IMPLANT
KIT BASIN OR (CUSTOM PROCEDURE TRAY) ×3 IMPLANT
KIT CLEAN ENDO COMPLIANCE (KITS) ×6 IMPLANT
KIT TURNOVER KIT B (KITS) ×3 IMPLANT
MARKER SKIN DUAL TIP RULER LAB (MISCELLANEOUS) ×3 IMPLANT
NEEDLE ASPIRATION VIZISHOT 19G (NEEDLE) ×3 IMPLANT
NEEDLE FILTER BLUNT 18X 1/2SAF (NEEDLE) ×1
NEEDLE FILTER BLUNT 18X1 1/2 (NEEDLE) ×2 IMPLANT
NS IRRIG 1000ML POUR BTL (IV SOLUTION) ×6 IMPLANT
OIL SILICONE PENTAX (PARTS (SERVICE/REPAIRS)) ×3 IMPLANT
PACK SURGICAL SETUP 50X90 (CUSTOM PROCEDURE TRAY) IMPLANT
PAD ARMBOARD 7.5X6 YLW CONV (MISCELLANEOUS) ×12 IMPLANT
SUT VIC AB 3-0 SH 18 (SUTURE) ×3 IMPLANT
SUT VICRYL 4-0 PS2 18IN ABS (SUTURE) ×3 IMPLANT
SWAB COLLECTION DEVICE MRSA (MISCELLANEOUS) IMPLANT
SWAB CULTURE ESWAB REG 1ML (MISCELLANEOUS) IMPLANT
SYR 20CC LL (SYRINGE) ×3 IMPLANT
SYR 20ML ECCENTRIC (SYRINGE) ×3 IMPLANT
TOWEL GREEN STERILE FF (TOWEL DISPOSABLE) ×6 IMPLANT
TRAP SPECIMEN MUCOUS 40CC (MISCELLANEOUS) ×3 IMPLANT
TUBE CONNECTING 12X1/4 (SUCTIONS) ×3 IMPLANT
VALVE BIOPSY  SINGLE USE (MISCELLANEOUS) ×3
VALVE BIOPSY SINGLE USE (MISCELLANEOUS) ×6 IMPLANT
WATER STERILE IRR 1000ML POUR (IV SOLUTION) ×6 IMPLANT

## 2018-04-22 NOTE — Anesthesia Procedure Notes (Signed)
Procedure Name: Intubation Date/Time: 04/22/2018 4:40 PM Performed by: Myna Bright, CRNA Pre-anesthesia Checklist: Patient identified, Emergency Drugs available, Patient being monitored and Suction available Patient Re-evaluated:Patient Re-evaluated prior to induction Oxygen Delivery Method: Circle system utilized Preoxygenation: Pre-oxygenation with 100% oxygen Induction Type: IV induction Ventilation: Mask ventilation without difficulty and Oral airway inserted - appropriate to patient size Laryngoscope Size: Mac and 3 Grade View: Grade I Tube type: Oral Tube size: 8.5 mm Number of attempts: 1 Airway Equipment and Method: Stylet Placement Confirmation: ETT inserted through vocal cords under direct vision,  positive ETCO2 and breath sounds checked- equal and bilateral Secured at: 21 cm Tube secured with: Tape Dental Injury: Teeth and Oropharynx as per pre-operative assessment

## 2018-04-22 NOTE — Anesthesia Preprocedure Evaluation (Addendum)
Anesthesia Evaluation  Patient identified by MRN, date of birth, ID band Patient awake    Reviewed: Allergy & Precautions, NPO status , Patient's Chart, lab work & pertinent test results, reviewed documented beta blocker date and time   History of Anesthesia Complications Negative for: history of anesthetic complications  Airway Mallampati: I  TM Distance: >3 FB Neck ROM: Full    Dental  (+) Dental Advisory Given, Poor Dentition   Pulmonary COPD, Current Smoker,    Pulmonary exam normal        Cardiovascular hypertension, Pt. on medications and Pt. on home beta blockers Normal cardiovascular exam     Neuro/Psych negative neurological ROS     GI/Hepatic negative GI ROS, Neg liver ROS,   Endo/Other  diabetesMorbid obesity  Renal/GU negative Renal ROS     Musculoskeletal negative musculoskeletal ROS (+)   Abdominal   Peds  Hematology negative hematology ROS (+)   Anesthesia Other Findings Day of surgery medications reviewed with the patient.  Reproductive/Obstetrics                            Anesthesia Physical Anesthesia Plan  ASA: III  Anesthesia Plan: General   Post-op Pain Management:    Induction: Intravenous  PONV Risk Score and Plan: 2 and Ondansetron and Dexamethasone  Airway Management Planned: Oral ETT  Additional Equipment:   Intra-op Plan:   Post-operative Plan: Extubation in OR  Informed Consent: I have reviewed the patients History and Physical, chart, labs and discussed the procedure including the risks, benefits and alternatives for the proposed anesthesia with the patient or authorized representative who has indicated his/her understanding and acceptance.   Dental advisory given  Plan Discussed with: CRNA and Anesthesiologist  Anesthesia Plan Comments:        Anesthesia Quick Evaluation

## 2018-04-22 NOTE — Progress Notes (Signed)
Patient interviewed in preop. Patient able to confirm name, DOB, procedure, allergies, metal in left knee, npo status and no pain. Family at bedside.   Leatha Gilding, RN

## 2018-04-22 NOTE — Progress Notes (Signed)
Patient Demographics:    Alicia Morse, is a 67 y.o. female, DOB - 1951-02-14, IHK:[id number]  Admit date - 04/21/2018   Admitting Physician Vianne Bulls, MD  Outpatient Primary MD for the patient is Alicia Morse., MD  LOS - 1   No chief complaint on file.       Subjective:    [name] today has no fevers, no emesis,  No chest pain,  C/o cough and shortness of breath  Assessment  & Plan :    Principal Problem:   Mass of right lung Active Problems:   COPD with acute exacerbation (HCC)   Syncope, vasovagal   Essential hypertension   Acute respiratory failure with hypoxia (HCC)   Adrenal mass (HCC)   AKI (acute kidney injury) ([place])   Diabetes mellitus type II, non insulin dependent ([place])   Hypokalemia  Brief Summary 67 y.o. female with medical history significant for COPD, type 2 diabetes mellitus, hypertension, and anxiety transferred from [facility] with CT chest suggesting right upper lobe mass with adenopathy and an adrenal mass.  CT surgery consult appreciated, for VATS on 04/22/2018   Plan:- 1)RUL Mass-with adenopathy and adrenal mass--- oncology requested tissue diagnosis, CT surgery consult appreciated, for VATS with biopsy on 04/22/2018  2) acute COPD exacerbation--patient apparently had a coughing spell and has syncope associated with a coughing spell on 04/21/2018, no further syncopal concerns, continue mucolytics, bronchodilators, Levaquin and prednisone  3)AKI----acute kidney injury on CKD stage -     creatinine on admission=  1.6 (at [facility]) ,   baseline creatinine = 1.0   , creatinine is now= 0.9      , renally adjust medications, avoid nephrotoxic agents/dehydration/hypotension, continue to hold losartan and metformin until renal function improves further, also hold Dyazide  4)DM2-last A1c was 6.6 metformin on hold, Use Novolog/Humalog Sliding scale insulin  with Accu-Cheks/Fingersticks as ordered , anticipate some degree of hyperglycemia due to steroid use  5)HTN -stable, continue Toprol-XL 50 mill grams daily, IV hydralazine as needed elevated BP, continue to hold losartan and Dyazide due to kidney concerns as above #3  6)Depression/Anxiety-okay to continue Celexa 40 mg daily, Remeron 15 mg nightly, and trazodone for sleep  Disposition/Need for in-Hospital Stay- patient unable to be discharged at this time due to RUL requiring VATS with biopsy for tissue diagnosis as well acute COPD exacerbation with shortness of breath  Code Status : full    Disposition Plan  : TBD  Consults  :  CT surgery   DVT Prophylaxis  :   SCDs    Lab Results  Component Value Date   PLT 317 04/22/2018    Inpatient Medications  Scheduled Meds: . [MAR Hold] Chlorhexidine Gluconate Cloth  6 each Topical Q0600  . [MAR Hold] citalopram  40 mg Oral QPM  . [MAR Hold] gabapentin  300 mg Oral QHS  . [MAR Hold] insulin aspart  0-9 Units Subcutaneous Q4H  . [MAR Hold] ipratropium-albuterol  3 mL Nebulization TID  . [MAR Hold] metoprolol succinate  50 mg Oral Daily  . [MAR Hold] mirtazapine  15 mg Oral QHS  . [MAR Hold] mometasone-formoterol  2 puff Inhalation BID  . [MAR Hold] mupirocin ointment  1 application Nasal BID  . [  MAR Hold] predniSONE  50 mg Oral Q breakfast  . [MAR Hold] rosuvastatin  40 mg Oral q1800  . [MAR Hold] sodium chloride flush  3 mL Intravenous Q12H  . [MAR Hold] traZODone  100 mg Oral QHS   Continuous Infusions: . lactated ringers    . [MAR Hold] levofloxacin (LEVAQUIN) IV 750 mg (04/22/18 1222)  . vancomycin     PRN Meds:.[MAR Hold] acetaminophen **OR** [MAR Hold] acetaminophen, [MAR Hold] albuterol, [MAR Hold] ALPRAZolam, [MAR Hold] bisacodyl, [MAR Hold] guaiFENesin, [MAR Hold] hydrALAZINE, [MAR Hold] HYDROcodone-acetaminophen, [MAR Hold] ondansetron **OR** [MAR Hold] ondansetron (ZOFRAN) IV, [MAR Hold]  senna-docusate    Anti-infectives (From admission, onward)   Start     Dose/Rate Route Frequency Ordered Stop   04/22/18 1300  vancomycin (VANCOCIN) IVPB 1000 mg/200 mL premix     1,000 mg 200 mL/hr over 60 Minutes Intravenous On call to O.R. 04/22/18 1121 04/23/18 0559   04/22/18 1200  [MAR Hold]  levofloxacin (LEVAQUIN) IVPB 750 mg     (MAR Hold since Wed 04/22/2018 at 1417. Reason: Transfer to a Procedural area.)   750 mg 100 mL/hr over 90 Minutes Intravenous Every 24 hours 04/21/18 2339          Objective:   Vitals:   04/21/18 2327 04/21/18 2343 04/22/18 0806 04/22/18 0829  BP:  (!) 152/85 130/69   Pulse:  82 76   Resp:  19    Temp:  98.3 F (36.8 C) 98.1 F (36.7 C)   TempSrc:  Oral Oral   SpO2: 98% 98% 100% 100%  Weight:        Wt Readings from Last 3 Encounters:  04/21/18 87.1 kg     Intake/Output Summary (Last 24 hours) at 04/22/2018 1555 Last data filed at 04/21/2018 2200 Gross per 24 hour  Intake 240 ml  Output -  Net 240 ml     Physical Exam Patient is examined daily including today on 04/22/18 , exams remain the same as of yesterday except that has changed   Gen:- Awake Alert, no acute distress HEENT:- [place].AT, No sclera icterus Neck-Supple Neck,No JVD,.  Lungs-diminished in bases scattered wheezes in upper lung fields CV- S1, S2 normal Abd-  +ve B.Sounds, Abd Soft, No tenderness,    Extremity/Skin:- No  edema,   good pulses Psych-affect is appropriate, oriented x3 Neuro-no new focal deficits, no tremors   Data Review:   Micro Results Recent Results (from the past 240 hour(s))  MRSA PCR Screening     Status: None   Collection Time: 04/22/18  1:43 PM  Result Value Ref Range Status   MRSA by PCR NEGATIVE NEGATIVE Final    Comment:        The GeneXpert MRSA Assay (FDA approved for NASAL specimens only), is one component of a comprehensive MRSA colonization surveillance program. It is not intended to diagnose MRSA infection nor to guide  or monitor treatment for MRSA infections. Performed at [facility], [place] [address] [place], [place] [address]     Radiology Reports Dg Chest 2 View  Result Date: 04/22/2018 CLINICAL DATA:  Shortness of breath. EXAM: CHEST - 2 VIEW COMPARISON:  CT scan and radiographs of April 20, 2018. FINDINGS: Stable cardiomediastinal silhouette. Atherosclerosis of thoracic aorta is noted. Right suprahilar mass is noted as described on prior CT scan with associated atelectasis. Left lung is clear. No pneumothorax or pleural effusion is noted. IMPRESSION: Stable appearance of right suprahilar mass and associated subsegmental atelectasis as described on  prior CT scan. No significant change compared to prior exam. Aortic Atherosclerosis (ICD10-I70.0). Electronically Signed   By: Marijo Conception, M.D.   On: 04/22/2018 13:58     CBC Recent Labs  Lab 04/21/18 2153 04/22/18 1242  WBC 9.4 10.2  HGB 12.4 13.3  HCT 39.6 41.7  PLT 324 317  MCV 86.8 85.6  MCH 27.2 27.3  MCHC 31.3 31.9  RDW 15.5 15.6*  LYMPHSABS 0.9  --   MONOABS 0.8  --   EOSABS 0.0  --   BASOSABS 0.0  --     Chemistries  Recent Labs  Lab 04/21/18 2153 04/22/18 1242  NA 141 140  K 4.6 3.8  CL 108 107  CO2 22 19*  GLUCOSE 112* 184*  BUN 23 19  CREATININE 1.07* 0.99  CALCIUM 10.4* 10.0  AST 15 16  ALT 19 17  ALKPHOS 56 51  BILITOT 0.5 0.3   ------------------------------------------------------------------------------------------------------------------ No results for input(s): CHOL, HDL, LDLCALC, TRIG, CHOLHDL, LDLDIRECT in the last 72 hours.  No results found for: HGBA1C ------------------------------------------------------------------------------------------------------------------ No results for input(s): TSH, T4TOTAL, T3FREE, THYROIDAB in the last 72 hours.  Invalid input(s): FREET3 ------------------------------------------------------------------------------------------------------------------ No  results for input(s): VITAMINB12, FOLATE, FERRITIN, TIBC, IRON, RETICCTPCT in the last 72 hours.  Coagulation profile Recent Labs  Lab 04/22/18 1242  INR 1.19    No results for input(s): DDIMER in the last 72 hours.  Cardiac Enzymes No results for input(s): CKMB, TROPONINI, MYOGLOBIN in the last 168 hours.  Invalid input(s): CK ------------------------------------------------------------------------------------------------------------------ No results found for: BNP   Roxan Hockey M.D on 04/22/2018 at 3:55 PM  Pager---[phone] Go to [email or web address] - password [email or web address] for contact info  [facility] - Office  [phone]

## 2018-04-22 NOTE — Brief Op Note (Addendum)
      [address]       [place] [address]             [phone]      04/22/2018  5:02 PM  PATIENT:  Despina Pole  67 y.o. female  PRE-OPERATIVE DIAGNOSIS:  Right lung mass  POST-OPERATIVE DIAGNOSIS:  Right lung mass-Small cell lung cancer on quick smear  PROCEDURE: VIDEO BRONCHOSCOPY WITH ENDOBRONCHIAL ULTRASOUND   SURGEON:  Surgeon(s) and Role:    Grace Isaac, MD - Primary  ANESTHESIA:   general   BLOOD ADMINISTERED:none  SPECIMEN:  Source of Specimen:  Brushings and biopsy of RUL, and #7 lymph node  DISPOSITION OF SPECIMEN:  PATHOLOGY  COUNTS CORRECT:  YES  DICTATION: .Dragon Dictation  PLAN OF CARE: Admit to inpatient   PATIENT DISPOSITION:  PACU - hemodynamically stable.   Delay start of Pharmacological VTE agent (>24hrs) due to surgical blood loss or risk of bleeding: no  RECOMMEND MRI of BRAIN may have to do CT since has artifical joint and implanted nerve stimulator   ; will need follow up with oncologist Dr. Hinton Rao in [place] ASAP

## 2018-04-22 NOTE — Transfer of Care (Signed)
Immediate Anesthesia Transfer of Care Note  Patient: Alicia Morse  Procedure(s) Performed: VIDEO BRONCHOSCOPY WITH ENDOBRONCHIAL ULTRASOUND (N/A Bronchus)  Patient Location: PACU  Anesthesia Type:General  Level of Consciousness: awake, alert , oriented and patient cooperative  Airway & Oxygen Therapy: Patient Spontanous Breathing and Patient connected to nasal cannula oxygen  Post-op Assessment: Report given to RN, Post -op Vital signs reviewed and stable and Patient moving all extremities  Post vital signs: Reviewed and stable  Last Vitals:  Vitals Value Taken Time  BP 169/74 04/22/2018  5:52 PM  Temp    Pulse 98 04/22/2018  5:53 PM  Resp 17 04/22/2018  5:53 PM  SpO2 97 % 04/22/2018  5:53 PM  Vitals shown include unvalidated device data.  Last Pain:  Vitals:   04/22/18 1740  TempSrc:   PainSc: 0-No pain         Complications: No apparent anesthesia complications

## 2018-04-22 NOTE — Consult Note (Signed)
[facility] Record #[id number] Date of Birth: 1951/01/01  Referring: No ref. provider found Primary Care: [name]., MD Primary Cardiologist:No primary care provider on file.  Chief Complaint: Shortness of breath, productive cough  History of Present Illness:     Alicia Morse is a 67 year old female patient with a past medical history significant for hypertension, type 2 diabetes mellitus, COPD, and anxiety who presented to the emergency department for evaluation of shortness of breath and productive cough.  She did complete a course of azithromycin last week.  Her symptoms started with a chronic mild cough that worsened a few weeks ago and became productive with thick sputum.  She does have associated chills and report a history of night sweats.  She denies chest pain or hemoptysis.  She did have one episode of vomiting after coughing but no hematemesis, nausea or diarrhea.  She is a current smoker but has more recently cut back from 2 packs a day to 1/4 pack a day.  While at the ED at [facility] a CTA was done and was negative for PE but there was a right lung mass with adenopathy and adrenal masses found.  Oncology was consulted and recommended obtaining tissue sampling. We plan to perform endobronchial ultrasound today to assist diagnosis.    Current Activity/ Functional Status: Patient was independent with mobility/ambulation, transfers, ADL's, IADL's.   Zubrod Score: At the time of surgery this patient's most appropriate activity status/level should be described as: []     0    Normal activity, no symptoms [x]     1    Restricted in physical strenuous activity but ambulatory, able to do out light work []     2    Ambulatory and capable of self care, unable to do work activities, up and about                 more than 50%  Of the time                            []     3    Only limited self care, in bed greater than 50% of waking hours []     4    Completely  disabled, no self care, confined to bed or chair []     5    Moribund      Past Medical History:  Diagnosis Date  . COPD (chronic obstructive pulmonary disease) ([place])   . Diabetes mellitus without complication ([place])   . Hypertension     History reviewed. No pertinent surgical history.  Social History      Tobacco Use  Smoking Status Current Every Day Smoker  Smokeless Tobacco Never Used    Social History      Substance and Sexual Activity  Alcohol Use Not Currently         Allergies  Allergen Reactions  . Penicillins Anaphylaxis             Current Facility-Administered Medications  Medication Dose Route Frequency Provider Last Rate Last Dose  . 0.9 %  sodium chloride infusion   Intravenous Continuous Opyd, Ilene Qua, MD 75 mL/hr at 04/21/18 2203    . acetaminophen (TYLENOL) tablet 650 mg  650 mg Oral Q6H PRN Opyd, Ilene Qua, MD       Or  . acetaminophen (TYLENOL) suppository 650 mg  650 mg Rectal Q6H PRN Opyd, Ilene Qua, MD      .  albuterol (PROVENTIL) (2.5 MG/3ML) 0.083% nebulizer solution 2.5 mg  2.5 mg Nebulization Q4H PRN Opyd, Ilene Qua, MD      . bisacodyl (DULCOLAX) EC tablet 5 mg  5 mg Oral Daily PRN Opyd, Ilene Qua, MD      . citalopram (CELEXA) tablet 40 mg  40 mg Oral QPM Opyd, Ilene Qua, MD   40 mg at 04/21/18 2201  . gabapentin (NEURONTIN) capsule 300 mg  300 mg Oral QHS Opyd, Ilene Qua, MD   300 mg at 04/21/18 2200  . guaiFENesin (MUCINEX) 12 hr tablet 600 mg  600 mg Oral BID PRN Opyd, Ilene Qua, MD      . hydrALAZINE (APRESOLINE) injection 10 mg  10 mg Intravenous Q4H PRN Opyd, Ilene Qua, MD      . HYDROcodone-acetaminophen (NORCO/VICODIN) 5-325 MG per tablet 1-2 tablet  1-2 tablet Oral Q4H PRN Opyd, Ilene Qua, MD      . insulin aspart (novoLOG) injection 0-9 Units  0-9 Units Subcutaneous Q4H Opyd, Timothy S, MD      . ipratropium-albuterol (DUONEB) 0.5-2.5 (3) MG/3ML nebulizer solution 3 mL  3 mL Nebulization TID Opyd, Ilene Qua, MD   3  mL at 04/21/18 2327  . levofloxacin (LEVAQUIN) IVPB 750 mg  750 mg Intravenous Q24H Laren Everts, RPH      . metoprolol succinate (TOPROL-XL) 24 hr tablet 50 mg  50 mg Oral Daily Opyd, Ilene Qua, MD      . mirtazapine (REMERON) tablet 15 mg  15 mg Oral QHS Opyd, Ilene Qua, MD   15 mg at 04/21/18 2201  . mometasone-formoterol (DULERA) 100-5 MCG/ACT inhaler 2 puff  2 puff Inhalation BID Opyd, Ilene Qua, MD      . ondansetron (ZOFRAN) tablet 4 mg  4 mg Oral Q6H PRN Opyd, Ilene Qua, MD       Or  . ondansetron (ZOFRAN) injection 4 mg  4 mg Intravenous Q6H PRN Opyd, Ilene Qua, MD      . predniSONE (DELTASONE) tablet 50 mg  50 mg Oral Q breakfast Opyd, Ilene Qua, MD      . rosuvastatin (CRESTOR) tablet 40 mg  40 mg Oral q1800 Opyd, Ilene Qua, MD   40 mg at 04/21/18 2201  . senna-docusate (Senokot-S) tablet 1 tablet  1 tablet Oral QHS PRN Opyd, Ilene Qua, MD      . sodium chloride flush (NS) 0.9 % injection 3 mL  3 mL Intravenous Q12H Opyd, Ilene Qua, MD   3 mL at 04/21/18 2202           Medications Prior to Admission  Medication Sig Dispense Refill Last Dose  . aspirin 325 MG tablet Take 325 mg by mouth daily.     . citalopram (CELEXA) 40 MG tablet Take 40 mg by mouth every evening.  1   . gabapentin (NEURONTIN) 300 MG capsule Take 300 mg by mouth at bedtime.     Marland Kitchen losartan (COZAAR) 100 MG tablet Take 100 mg by mouth daily.  1   . metFORMIN (GLUCOPHAGE-XR) 500 MG 24 hr tablet Take 500 mg by mouth 2 (two) times daily.  1   . metoprolol succinate (TOPROL-XL) 50 MG 24 hr tablet Take 50 mg by mouth daily.  3   . mirtazapine (REMERON) 15 MG tablet Take 15 mg by mouth at bedtime.     . rosuvastatin (CRESTOR) 40 MG tablet Take 40 mg by mouth every evening.  1   . senna (SENOKOT) 8.6 MG tablet  Take 3 tablets by mouth every evening.     . triamterene-hydrochlorothiazide (DYAZIDE) 37.5-25 MG capsule Take 1 capsule by mouth daily.  0          Family History  Problem  Relation Age of Onset  . Breast cancer Mother   . Colon cancer Father      Review of Systems:   Review of Systems  Constitutional: Positive for chills, fever and malaise/fatigue.  HENT: Negative.   Respiratory: Positive for cough and sputum production. Negative for hemoptysis.   Cardiovascular: Negative for chest pain and leg swelling.  Gastrointestinal: Positive for vomiting. Negative for abdominal pain, diarrhea and nausea.  Genitourinary: Negative.   Neurological: Negative.   Psychiatric/Behavioral: The patient is nervous/anxious.    Pertinent items are noted in HPI.                Physical Exam: BP (!) 152/85 (BP Location: Left Arm)   Pulse 82   Temp 98.3 F (36.8 C) (Oral)   Resp 19   Wt 87.1 kg   SpO2 98%    General appearance: alert, cooperative and no distress Resp: diffuse rhonchi and coarse breath sounds Cardio: regular rate and rhythm, S1, S2 normal, no murmur, click, rub or gallop GI: soft, non-tender; bowel sounds normal; no masses,  no organomegaly Extremities: extremities normal, atraumatic, no cyanosis or edema Neurologic: Grossly normal  Diagnostic Studies & Laboratory data:  CTA report available in chart. No imaging discs.      Recent Radiology Findings:    ImagingResults(Last48hours)  No results found.     I have independently reviewed the above radiologic studies and discussed with the patient   Recent Lab Findings: RecentLabs       Lab Results  Component Value Date   WBC 9.4 04/21/2018   HGB 12.4 04/21/2018   HCT 39.6 04/21/2018   PLT 324 04/21/2018   GLUCOSE 112 (H) 04/21/2018   ALT 19 04/21/2018   AST 15 04/21/2018   NA 141 04/21/2018   K 4.6 04/21/2018   CL 108 04/21/2018   CREATININE 1.07 (H) 04/21/2018   BUN 23 04/21/2018   CO2 22 04/21/2018        Assessment / Plan:      1. COPD with exacerbation- continued productive cough and shortness of breath. Already had a course of  azithromycin without symptom relief. Continue nebs, prednisone, and Levaquin.  2. Right lung mass-incidental finding on CTA. No PE. Plan for EBUS today. 3. AKI-holding nephrotoxic medications. Recent creatinine was 1.07 4. Hypertension-moderately controlled. On Toprol. 5. Type 2 DM-holding metformin at present. Continue SSI. Last A1c was 6.6.  Plan: To the OR today for EBUS possible Mediastinoscopy. The procedure was explained in detail to the patient and all questions were answered to her satisfaction. There was no family at the bedside.   I  spent 30 minutes counseling the patient face to face.  I have discussed with the patient and husband the likely dx of lung cancer and reccommended proceeding bronchoscopy , ebus and poss mediastinoscpy to get a tissue dx.  The goals risks and alternatives of the planned surgical procedure Procedure(s): VIDEO BRONCHOSCOPY WITH ENDOBRONCHIAL ULTRASOUND (N/A) possible MEDIASTINOSCOPY (N/A)  have been discussed with the patient in detail. The risks of the procedure including death, infection, stroke, myocardial infarction, bleeding, blood transfusion have all been discussed specifically.  I have quoted Despina Pole a 1 % of perioperative mortality and a complication rate as high as  10%. The patient's  questions have been answered.Alicia Morse is willing  to proceed with the planned procedure.   Grace Isaac MD      [address] [place] [address] Office [phone]   Beeper [phone]  04/22/2018 6:47 AM

## 2018-04-23 ENCOUNTER — Inpatient Hospital Stay (HOSPITAL_COMMUNITY): Payer: Medicare Other

## 2018-04-23 DIAGNOSIS — C3491 Malignant neoplasm of unspecified part of right bronchus or lung: Secondary | ICD-10-CM

## 2018-04-23 LAB — GLUCOSE, CAPILLARY
GLUCOSE-CAPILLARY: 196 mg/dL — AB (ref 70–99)
Glucose-Capillary: 98 mg/dL (ref 70–99)
Glucose-Capillary: 99 mg/dL (ref 70–99)
Glucose-Capillary: 126 mg/dL — ABNORMAL HIGH (ref 70–99)

## 2018-04-23 MED ORDER — IOHEXOL 300 MG/ML  SOLN
75.0000 mL | Freq: Once | INTRAMUSCULAR | Status: AC | PRN
  Administered 2018-04-23: 75 mL via INTRAVENOUS

## 2018-04-23 MED ORDER — GUAIFENESIN ER 600 MG PO TB12: 600.0000 mg | ORAL_TABLET | Freq: Two times a day (BID) | ORAL | 2 refills | Status: AC | PRN

## 2018-04-23 MED ORDER — TRAZODONE HCL 100 MG PO TABS: 100.0000 mg | ORAL_TABLET | Freq: Every evening | ORAL | 0 refills | Status: AC | PRN

## 2018-04-23 MED ORDER — PREDNISONE 50 MG PO TABS: 50.0000 mg | ORAL_TABLET | Freq: Every day | ORAL | 0 refills | Status: DC

## 2018-04-23 MED ORDER — PREDNISONE 20 MG PO TABS: 20.0000 mg | ORAL_TABLET | Freq: Every day | ORAL | 0 refills | Status: AC

## 2018-04-23 MED ORDER — METFORMIN HCL ER 500 MG PO TB24: 500.0000 mg | ORAL_TABLET | Freq: Two times a day (BID) | ORAL | 1 refills | Status: AC

## 2018-04-23 NOTE — Anesthesia Postprocedure Evaluation (Signed)
Anesthesia Post Note  Patient: Alicia Morse  Procedure(s) Performed: VIDEO BRONCHOSCOPY WITH ENDOBRONCHIAL ULTRASOUND (N/A [place])     Patient location during evaluation: PACU Anesthesia Type: General Level of consciousness: awake and alert Pain management: pain level controlled Vital Signs Assessment: post-procedure vital signs reviewed and stable Respiratory status: spontaneous breathing, nonlabored ventilation, respiratory function stable and patient connected to nasal cannula oxygen Cardiovascular status: blood pressure returned to baseline and stable Postop Assessment: no apparent nausea or vomiting Anesthetic complications: no    Last Vitals:  Vitals:   04/23/18 0822 04/23/18 1620  BP:    Pulse:  (!) 101  Resp:  18  Temp:    SpO2: 100% 94%    Last Pain:  Vitals:   04/23/18 0800  TempSrc:   PainSc: 0-No pain   Pain Goal:                 Audry Pili

## 2018-04-23 NOTE — Progress Notes (Signed)
[address]       [place] [address]             [phone]                 1 Day Post-Op Procedure(s) (LRB): VIDEO BRONCHOSCOPY WITH ENDOBRONCHIAL ULTRASOUND (N/A)  LOS: 2 days   Subjective: Stable, some cough   Objective: Vital signs in last 24 hours: Patient Vitals for the past 24 hrs:  BP Temp Temp src Pulse Resp SpO2  04/23/18 0822 - - - - - 100 %  04/23/18 0820 - - - 90 20 97 %  04/23/18 0723 (!) 156/74 98.4 F (36.9 C) Oral 73 15 96 %  04/23/18 0433 (!) 157/80 98.2 F (36.8 C) Oral - 20 97 %  04/22/18 2346 (!) 129/54 98.5 F (36.9 C) Oral - 20 96 %  04/22/18 2028 - - - - - 99 %  04/22/18 1952 (!) 158/77 98.8 F (37.1 C) Oral - 19 97 %  04/22/18 1810 (!) 143/86 98.9 F (37.2 C) - 91 18 100 %  04/22/18 1755 (!) 169/74 - - 96 (!) 23 96 %  04/22/18 1740 (!) 180/85 98.2 F (36.8 C) - 100 (!) 26 96 %    Filed Weights   04/21/18 2200  Weight: 87.1 kg    Hemodynamic parameters for last 24 hours:    Intake/Output from previous day: 09/25 0701 - 09/26 0700 In: 2550.5 [P.O.:400; I.V.:1761.4; IV Piggyback:389.1] Out: 1325 [Urine:1300; Blood:25] Intake/Output this shift: No intake/output data recorded.  Scheduled Meds: . citalopram  40 mg Oral QPM  . gabapentin  300 mg Oral QHS  . insulin aspart  0-9 Units Subcutaneous Q4H  . ipratropium-albuterol  3 mL Nebulization TID  . metoprolol succinate  50 mg Oral Daily  . mirtazapine  15 mg Oral QHS  . mometasone-formoterol  2 puff Inhalation BID  . predniSONE  50 mg Oral Q breakfast  . rosuvastatin  40 mg Oral q1800  . sodium chloride flush  3 mL Intravenous Q12H  . traZODone  100 mg Oral QHS   Continuous Infusions: . lactated ringers 100 mL/hr at 04/23/18 0511  . levofloxacin (LEVAQUIN) IV 750 mg (04/22/18 1222)   PRN Meds:.acetaminophen **OR** acetaminophen, albuterol, ALPRAZolam, bisacodyl, guaiFENesin, hydrALAZINE, HYDROcodone-acetaminophen, ondansetron **OR** ondansetron (ZOFRAN)  IV, senna-docusate  General appearance: alert and cooperative Neurologic: intact Heart: regular rate and rhythm, S1, S2 normal, no murmur, click, rub or gallop Lungs: diminished breath sounds RLL and RML Abdomen: soft, non-tender; bowel sounds normal; no masses,  no organomegaly Extremities: extremities normal, atraumatic, no cyanosis or edema  Lab Results: CBC: Recent Labs    04/21/18 2153 04/22/18 1242  WBC 9.4 10.2  HGB 12.4 13.3  HCT 39.6 41.7  PLT 324 317   BMET:  Recent Labs    04/21/18 2153 04/22/18 1242  NA 141 140  K 4.6 3.8  CL 108 107  CO2 22 19*  GLUCOSE 112* 184*  BUN 23 19  CREATININE 1.07* 0.99  CALCIUM 10.4* 10.0    PT/INR:  Recent Labs    04/22/18 1242  LABPROT 15.0  INR 1.19     Radiology Dg Chest 2 View  Result Date: 04/22/2018 CLINICAL DATA:  Shortness of breath. EXAM: CHEST - 2 VIEW COMPARISON:  CT scan and radiographs of April 20, 2018. FINDINGS: Stable cardiomediastinal silhouette. Atherosclerosis of thoracic aorta is noted. Right suprahilar mass is noted as described on prior CT scan with  associated atelectasis. Left lung is clear. No pneumothorax or pleural effusion is noted. IMPRESSION: Stable appearance of right suprahilar mass and associated subsegmental atelectasis as described on prior CT scan. No significant change compared to prior exam. Aortic Atherosclerosis (ICD10-I70.0). Electronically Signed   By: Marijo Conception, M.D.   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

## 2018-04-23 NOTE — Discharge Instructions (Signed)
1)Hold Metformin until 04/27/2018 due to contrast study done on 04/23/2018 2) follow-up with Vaughan Basta the nurse practitioner with Dr. Anabel Bene in [place] at 11 AM on Friday, 04/24/2018 for oncology evaluation

## 2018-04-23 NOTE — Discharge Summary (Signed)
Alicia Morse, is a 67 y.o. female  DOB 1950/08/20  MRN [id number].  Admission date:  04/21/2018  Admitting Physician  Vianne Bulls, MD  Discharge Date:  04/23/2018   Primary MD  [name]., MD  Recommendations for primary care physician for things to follow:   1)Hold Metformin until 04/27/2018 due to contrast study done on 04/23/2018 2) follow-up with Vaughan Basta the nurse practitioner with Dr. Anabel Bene in [place] at 11 AM on Friday, 04/24/2018 for oncology evaluation   Admission Diagnosis  COPD EXACERBATION   Discharge Diagnosis  COPD EXACERBATION    Principal Problem:   Small cell lung cancer, right ([place]) Active Problems:   COPD with acute exacerbation ([place])   Syncope, vasovagal   Mass of right lung   Essential hypertension   Acute respiratory failure with hypoxia (H[facility])   Adrenal mass ([facility])   AKI (acute kidney injury) ([place])   Diabetes mellitus type II, non insulin dependent ([place])   Hypokalemia      Past Medical History:  Diagnosis Date  . COPD (chronic obstructive pulmonary disease) ([place])   . Diabetes mellitus without complication ([place])   . Hypertension     History reviewed. No pertinent surgical history.     HPI  from the history and physical done on the day of admission:   PCP: [name]., MD   Patient coming from: Home, by way of [facility]   Chief Complaint: SOB, productive cough  HPI: SABA GOMM is a 67 y.o. female with medical history significant for COPD, type 2 diabetes mellitus, hypertension, and anxiety, who presented to the emergency department for evaluation of progressive shortness of breath and productive cough despite completing a course of azithromycin last week.  Patient reports a chronic mild cough that worsened a few weeks ago and became productive of thick sputum.  She reports some occasional chills and reports a long history of night sweats,  but denies recent weight loss.  She previously smoked 2 packs/day but has more recently cut back to ~1/4 ppd.  She denies chest pain or hemoptysis.  She had one episode of posttussive vomiting, but no hematemesis and no diarrhea.  [facility] ED & [facility] Course: Upon arrival to the ED, patient is found to be afebrile with cough and wheeze, saturating 80% on room air.  Creatinine was elevated at 1.60, up from her reported baseline of 1.0.  D-dimer was elevated.  CTA chest was negative for PE, but concerning for right upper lobe mass with thoracic LAD and possible adrenal mets.  She was treated with steroids, Levaquin, and nebs.  She had a syncopal episode in the setting of a coughing fit and quickly recovered.  There was no associated chest pain or palpitations.  Oncology recommended biopsy ASAP.  IR recommended consulting cardiothoracic surgery to see.  CT surgery at [facility] was consulted and agreed with transfer and the patient here with plan for VATS on 04/22/18.         Hospital Course:  Brief Summary 67 y.o.femalewith medical history significant forCOPD, type 2 diabetes mellitus, hypertension, and anxiety transferred from [facility] with CT chest suggesting right upper lobe mass with adenopathy and an adrenal mass.  CT surgery consult appreciated, for VIDEO BRONCHOSCOPY WITH ENDOBRONCHIAL ULTRASOUND with Rt UL Biopsy on 04/22/2018 with Small cell lung cancer on quick smear   Plan:- 1)Small cell Carcinoma of the Right Lung ------ s/p  VIDEO BRONCHOSCOPY WITH ENDOBRONCHIAL ULTRASOUND with Rt UL Biopsy by CT surgery on 04/22/2018 with Small cell lung cancer on quick smear, patient also has adenopathy and adrenal mass. Discussed with Vaughan Basta the nurse practitioner with Dr. Anabel Bene at [facility] oncology clinic, patient has an appointment with the oncology office on 04/24/2018 at 56 AM - oncology requested tissue diagnosis, CT surgery consult appreciated,  unable to do MRI brain for  staging due to patient having a nerve stimulator.  CT head with and without contrast on 04/23/18 does not demonstrate any evidence of metastasis to the brain  2)Acute COPD Exacerbation--patient apparently had a coughing spell and had syncope associated with a coughing spell on 04/21/2018, no further syncopal concerns, continue mucolytics, bronchodilators,  and prednisone 20 mg daily for 5 days, will stop Levaquin  3)AKI----acute kidney injury on CKD stage -     creatinine on admission=  1.6 (at [facility]) ,   baseline creatinine = 1.0   , creatinine is now= 0.9      , renally adjust medications, avoid nephrotoxic agents/dehydration/hypotension, renal function is normalized, but continue to hold metformin due to contrast study  4)DM2-last A1c was 6.6 metformin on hold due to contrast study,    5)HTN -stable, continue Toprol-XL 50 mg daily, okay to restart losartan and Dyazide renal function is normalized  6)Depression/Anxiety-okay to continue Celexa 40 mg daily, Remeron 15 mg nightly, and trazodone for sleep  7) tobacco abuse--- Smoking cessation counseling for 4 minutes today, consider nicotine patch  Code Status : full    Disposition Plan  : home  Consults  :  CT surgery  Discharge Condition: stable  Follow UP--- oncology Dr. Adelene Amas 04/24/2018 at 11 AM   Diet and Activity recommendation:  As advised  Discharge Instructions    Discharge Instructions    Call MD for:  difficulty breathing, headache or visual disturbances   Complete by:  As directed    Call MD for:  persistant dizziness or light-headedness   Complete by:  As directed    Call MD for:  persistant nausea and vomiting   Complete by:  As directed    Call MD for:  severe uncontrolled pain   Complete by:  As directed    Call MD for:  temperature >100.4   Complete by:  As directed    Diet - low sodium heart healthy   Complete by:  As directed    Discharge instructions   Complete by:  As directed     1)Hold Metformin until 04/27/2018 due to contrast study done on 04/23/2018 2) follow-up with Vaughan Basta the nurse practitioner with Dr. Anabel Bene in [place] at 11 AM on Friday, 04/24/2018 for oncology evaluation   Increase activity slowly   Complete by:  As directed         Discharge Medications     Allergies as of 04/23/2018      Reactions   Bee Venom Anaphylaxis   Penicillins Anaphylaxis   Has patient had a PCN reaction causing immediate rash, facial/tongue/throat swelling, SOB or lightheadedness with hypotension: Yes Has patient had  a PCN reaction causing severe rash involving mucus membranes or skin necrosis: No Has patient had a PCN reaction that required hospitalization: Yes Has patient had a PCN reaction occurring within the last 10 years: No If all of the above answers are "NO", then may proceed with Cephalosporin use.      Medication List    TAKE these medications   aspirin 325 MG tablet Take 325 mg by mouth daily.   citalopram 40 MG tablet Commonly known as:  CELEXA Take 40 mg by mouth every evening.   gabapentin 300 MG capsule Commonly known as:  NEURONTIN Take 300 mg by mouth at bedtime.   guaiFENesin 600 MG 12 hr tablet Commonly known as:  MUCINEX Take 1 tablet (600 mg total) by mouth 2 (two) times daily as needed for cough or to loosen phlegm.   losartan 100 MG tablet Commonly known as:  COZAAR Take 100 mg by mouth daily.   metFORMIN 500 MG 24 hr tablet Commonly known as:  GLUCOPHAGE-XR Take 1 tablet (500 mg total) by mouth 2 (two) times daily. Hold Metformin until 04/27/2018 due to contrast study done on 04/23/2018 What changed:  additional instructions   metoprolol succinate 50 MG 24 hr tablet Commonly known as:  TOPROL-XL Take 50 mg by mouth daily.   mirtazapine 15 MG tablet Commonly known as:  REMERON Take 15 mg by mouth at bedtime.   predniSONE 20 MG tablet Commonly known as:  DELTASONE Take 1 tablet (20 mg total) by mouth daily with breakfast.     rosuvastatin 40 MG tablet Commonly known as:  CRESTOR Take 40 mg by mouth every evening.   senna 8.6 MG tablet Commonly known as:  SENOKOT Take 3 tablets by mouth every evening.   SYMBICORT 160-4.5 MCG/ACT inhaler Generic drug:  budesonide-formoterol Inhale 2 puffs into the lungs 2 (two) times daily.   traZODone 100 MG tablet Commonly known as:  DESYREL Take 1 tablet (100 mg total) by mouth at bedtime as needed for sleep.   triamterene-hydrochlorothiazide 37.5-25 MG capsule Commonly known as:  DYAZIDE Take 1 capsule by mouth daily.       Major procedures and Radiology Reports - PLEASE review detailed and final reports for all details, in brief -    Dg Chest 2 View  Result Date: 04/22/2018 CLINICAL DATA:  Shortness of breath. EXAM: CHEST - 2 VIEW COMPARISON:  CT scan and radiographs of April 20, 2018. FINDINGS: Stable cardiomediastinal silhouette. Atherosclerosis of thoracic aorta is noted. Right suprahilar mass is noted as described on prior CT scan with associated atelectasis. Left lung is clear. No pneumothorax or pleural effusion is noted. IMPRESSION: Stable appearance of right suprahilar mass and associated subsegmental atelectasis as described on prior CT scan. No significant change compared to prior exam. Aortic Atherosclerosis (ICD10-I70.0). Electronically Signed   By: Marijo Conception, M.D.   On: 04/22/2018 13:58   Ct Head W & Wo Contrast  Result Date: 04/23/2018 CLINICAL DATA:  Lung cancer, staging. EXAM: CT HEAD WITHOUT AND WITH CONTRAST TECHNIQUE: Contiguous axial images were obtained from the base of the skull through the vertex without and with intravenous contrast CONTRAST:  52mL OMNIPAQUE IOHEXOL 300 MG/ML  SOLN COMPARISON:  CT head 11/02/2015 FINDINGS: Brain: No evidence for acute infarction, hemorrhage, mass lesion, hydrocephalus, or extra-axial fluid. Mild atrophy. Slight hypoattenuation of white matter, likely small vessel disease. Tiny remote lacunar infarct  LEFT posterior limb internal capsule/medial lentiform nucleus. Post infusion, no abnormal enhancement of the brain or  meninges. Asymmetric calcification RIGHT basal ganglia, physiologic. Vascular: No hyperdense vessel or unexpected calcification. Visible vessels are patent. Skull: Normal. Negative for fracture or focal lesion. Sinuses/Orbits: No acute finding.  LEFT cataract extraction. Other: None. IMPRESSION: Mild atrophy and small vessel disease. No acute intracranial findings. No abnormal postcontrast enhancement of the brain to suggest metastatic disease. Visualized osseous structures are intact. Electronically Signed   By: Staci Righter M.D.   On: 04/23/2018 15:02    Micro Results    Recent Results (from the past 240 hour(s))  MRSA PCR Screening     Status: None   Collection Time: 04/22/18  1:43 PM  Result Value Ref Range Status   MRSA by PCR NEGATIVE NEGATIVE Final    Comment:        The GeneXpert MRSA Assay (FDA approved for NASAL specimens only), is one component of a comprehensive MRSA colonization surveillance program. It is not intended to diagnose MRSA infection nor to guide or monitor treatment for MRSA infections. Performed at [facility], [place] [address] [place], [place] [address]     Today   Subjective    Marlynn Hinckley today has no new complaints, husband at bedside, questions answered,          Patient has been seen and examined prior to discharge   Objective   Blood pressure (!) 156/74, pulse (!) 101, temperature 98.4 F (36.9 C), temperature source Oral, resp. rate 18, weight 87.1 kg, SpO2 94 %.   Intake/Output Summary (Last 24 hours) at 04/23/2018 1732 Last data filed at 04/23/2018 1548 Gross per 24 hour  Intake 3193.57 ml  Output 2125 ml  Net 1068.57 ml    Exam Patient is examined daily including today on 04/23/18 , exams remain the same as of yesterday except that has changed   Gen:- Awake Alert, no acute distress HEENT:- [place].AT, No sclera  icterus Neck-Supple Neck,No JVD,.   Lungs-improved air movement, no significant wheezing at this time  CV- S1, S2 normal Abd-  +ve B.Sounds, Abd Soft, No tenderness,    Extremity/Skin:- No  edema,   good pulses Psych-affect is appropriate, oriented x3 Neuro-no new focal deficits, no tremors   Data Review   CBC w Diff:  Lab Results  Component Value Date   WBC 10.2 04/22/2018   HGB 13.3 04/22/2018   HCT 41.7 04/22/2018   PLT 317 04/22/2018   LYMPHOPCT 9 04/21/2018   MONOPCT 9 04/21/2018   EOSPCT 0 04/21/2018   BASOPCT 0 04/21/2018    CMP:  Lab Results  Component Value Date   NA 140 04/22/2018   K 3.8 04/22/2018   CL 107 04/22/2018   CO2 19 (L) 04/22/2018   BUN 19 04/22/2018   CREATININE 0.99 04/22/2018   PROT 7.9 04/22/2018   ALBUMIN 3.3 (L) 04/22/2018   BILITOT 0.3 04/22/2018   ALKPHOS 51 04/22/2018   AST 16 04/22/2018   ALT 17 04/22/2018    Total Discharge time is about 33 minutes  Roxan Hockey M.D on 04/23/2018 at 5:32 PM  Pager---[phone]  Go to [email or web address] - password [email or web address] for contact info  [facility] - Office  [phone]

## 2018-04-23 NOTE — Progress Notes (Signed)
SATURATION QUALIFICATIONS: (This note is used to comply with regulatory documentation for home oxygen)  Patient Saturations on Room Air at Rest = 96%  Patient Saturations on Room Air while Ambulating = 92%  Patient Saturations on 2 Liters of oxygen while Ambulating = 98%  Please briefly explain why patient needs home oxygen:  Patient became short of breath while still maintaining O2 sats >91% after walking approximately 150 feet. Mild weakness noted by patient but said she feels good overall.  Kinnie Scales, RN 12:18 PM 04/23/18

## 2018-04-24 ENCOUNTER — Encounter (HOSPITAL_COMMUNITY): Payer: Self-pay | Admitting: Cardiothoracic Surgery

## 2018-04-24 DIAGNOSIS — E278 Other specified disorders of adrenal gland: Secondary | ICD-10-CM | POA: Diagnosis not present

## 2018-04-24 DIAGNOSIS — C3411 Malignant neoplasm of upper lobe, right bronchus or lung: Secondary | ICD-10-CM

## 2018-04-24 DIAGNOSIS — D472 Monoclonal gammopathy: Secondary | ICD-10-CM

## 2018-04-24 DIAGNOSIS — F418 Other specified anxiety disorders: Secondary | ICD-10-CM

## 2018-04-24 DIAGNOSIS — J449 Chronic obstructive pulmonary disease, unspecified: Secondary | ICD-10-CM

## 2018-04-24 NOTE — Op Note (Signed)
NAME: Alicia Morse, ARVANITIS MEDICAL RECORD RS:[id number] ACCOUNT [id number] DATE OF BIRTH:[birth year] FACILITY: [facility] LOCATION: [facility] PHYSICIAN:Mallery Harshman Maryruth Bun, MD  OPERATIVE REPORT  DATE OF PROCEDURE:  04/22/2018  PREOPERATIVE DIAGNOSIS:  Right lung mass and mediastinal adenopathy.  POSTOPERATIVE DIAGNOSIS:  Right lung mass and mediastinal adenopathy.  Probable small cell carcinoma of the lung by quick stain.  PROCEDURE PERFORMED:  Video bronchoscopy with brushings and biopsy and bronchoscopic ultrasound with transbronchial biopsies.  SURGEON:  Lanelle Bal, MD  BRIEF HISTORY:  The patient is a 67 year old female who presented to [facility] with increasing cough and shortness of breath.  A CT scan of the chest done at [facility] demonstrated a right upper lobe mass and mediastinal adenopathy.  The  patient was transferred to [facility] for admission to the hospitalist service for a diagnosis of pneumonia.  On review of the scans, it was apparent that the patient had underlying lung malignancy involving the right upper lobe and mediastinal  adenopathy.  Bronchoscopy with EBUS and biopsy was recommended to the patient to obtain a tissue diagnosis.  She agreed and signed informed consent.  DESCRIPTION OF PROCEDURE:  The patient was brought to the operating room, confirming her identity.  She underwent general endotracheal anesthesia without incident.  Appropriate timeout was performed and we then proceeded with video bronchoscopy to the  subsegmental level in both the right and left tracheobronchial tree.  Photographs of the bronchoscopy were obtained, but could not be imported into the electronic medical record.  On examination, the patient had extrinsic compression of the distal  esophagus and proximal right mainstem bronchus.  The scope would pass through this area at the takeoff of the right upper lobe bronchus, was further extrinsic compression and in the orifice of the  right upper lobe were additional areas of compression.   Once the scope passed this area into the bronchus intermedius, the airways were relatively clear without any other endobronchial lesions.  The left side was without endobronchial lesions.  Brushings of the right upper lobe were obtained.  We then removed  the standard scope and placed a video ultrasound bronchoscope and easily found significantly large #7 nodes.  With a 53 gauge biopsy needle and under ultrasound guidance, multiple passes of the #7 nodes were obtained and submitted for quick stain  evaluation and the remaining tissue was placed in satellite for further studies.  While these slides were being examined by pathology, we went back to the standard scope, obtained more brushings of the right upper lobe and with a small biopsy forceps,  additional tissue of the right upper lobe bronchus.  The initial smears of the #7 nodes and initial brushing of the right upper lobe were all consistent with small cell carcinoma.  The remaining tissue was submitted for permanent slides and evaluation.   The tracheobronchial tree was cleared of all secretions.    The patient was then extubated in the operating room, tolerated the procedure without obvious complication and was transferred to the recovery room for further postoperative care.    She will be referred to Dr. 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  AN/NUANCE  D:04/24/2018 T:04/24/2018 JOB:[id number]

## 2018-05-04 DIAGNOSIS — J449 Chronic obstructive pulmonary disease, unspecified: Secondary | ICD-10-CM

## 2018-05-04 DIAGNOSIS — E860 Dehydration: Secondary | ICD-10-CM

## 2018-05-04 DIAGNOSIS — L292 Pruritus vulvae: Secondary | ICD-10-CM

## 2018-05-04 DIAGNOSIS — D472 Monoclonal gammopathy: Secondary | ICD-10-CM

## 2018-05-04 DIAGNOSIS — E876 Hypokalemia: Secondary | ICD-10-CM | POA: Diagnosis not present

## 2018-05-04 DIAGNOSIS — J188 Other pneumonia, unspecified organism: Secondary | ICD-10-CM

## 2018-05-04 DIAGNOSIS — C3411 Malignant neoplasm of upper lobe, right bronchus or lung: Secondary | ICD-10-CM | POA: Diagnosis not present

## 2018-05-25 DIAGNOSIS — D472 Monoclonal gammopathy: Secondary | ICD-10-CM | POA: Diagnosis not present

## 2018-05-25 DIAGNOSIS — F418 Other specified anxiety disorders: Secondary | ICD-10-CM

## 2018-05-25 DIAGNOSIS — J449 Chronic obstructive pulmonary disease, unspecified: Secondary | ICD-10-CM

## 2018-05-25 DIAGNOSIS — C3411 Malignant neoplasm of upper lobe, right bronchus or lung: Secondary | ICD-10-CM | POA: Diagnosis not present

## 2018-05-25 DIAGNOSIS — E876 Hypokalemia: Secondary | ICD-10-CM

## 2018-05-25 DIAGNOSIS — N390 Urinary tract infection, site not specified: Secondary | ICD-10-CM

## 2018-06-08 DIAGNOSIS — R5081 Fever presenting with conditions classified elsewhere: Secondary | ICD-10-CM

## 2018-06-08 DIAGNOSIS — R112 Nausea with vomiting, unspecified: Secondary | ICD-10-CM | POA: Diagnosis not present

## 2018-06-08 DIAGNOSIS — F418 Other specified anxiety disorders: Secondary | ICD-10-CM

## 2018-06-08 DIAGNOSIS — K632 Fistula of intestine: Secondary | ICD-10-CM

## 2018-06-08 DIAGNOSIS — D61810 Antineoplastic chemotherapy induced pancytopenia: Secondary | ICD-10-CM

## 2018-06-08 DIAGNOSIS — J90 Pleural effusion, not elsewhere classified: Secondary | ICD-10-CM

## 2018-06-08 DIAGNOSIS — E1165 Type 2 diabetes mellitus with hyperglycemia: Secondary | ICD-10-CM

## 2018-06-08 DIAGNOSIS — J449 Chronic obstructive pulmonary disease, unspecified: Secondary | ICD-10-CM

## 2018-06-09 DIAGNOSIS — D61810 Antineoplastic chemotherapy induced pancytopenia: Secondary | ICD-10-CM | POA: Diagnosis not present

## 2018-06-09 DIAGNOSIS — K632 Fistula of intestine: Secondary | ICD-10-CM | POA: Diagnosis not present

## 2018-06-09 DIAGNOSIS — J90 Pleural effusion, not elsewhere classified: Secondary | ICD-10-CM | POA: Diagnosis not present

## 2018-06-09 DIAGNOSIS — R112 Nausea with vomiting, unspecified: Secondary | ICD-10-CM | POA: Diagnosis not present

## 2018-06-10 DIAGNOSIS — K632 Fistula of intestine: Secondary | ICD-10-CM | POA: Diagnosis not present

## 2018-06-10 DIAGNOSIS — D61810 Antineoplastic chemotherapy induced pancytopenia: Secondary | ICD-10-CM | POA: Diagnosis not present

## 2018-06-10 DIAGNOSIS — R112 Nausea with vomiting, unspecified: Secondary | ICD-10-CM | POA: Diagnosis not present

## 2018-06-10 DIAGNOSIS — J90 Pleural effusion, not elsewhere classified: Secondary | ICD-10-CM | POA: Diagnosis not present

## 2018-06-11 DIAGNOSIS — J90 Pleural effusion, not elsewhere classified: Secondary | ICD-10-CM | POA: Diagnosis not present

## 2018-06-11 DIAGNOSIS — D61810 Antineoplastic chemotherapy induced pancytopenia: Secondary | ICD-10-CM | POA: Diagnosis not present

## 2018-06-11 DIAGNOSIS — R5081 Fever presenting with conditions classified elsewhere: Secondary | ICD-10-CM

## 2018-06-11 DIAGNOSIS — C3411 Malignant neoplasm of upper lobe, right bronchus or lung: Secondary | ICD-10-CM

## 2018-06-11 DIAGNOSIS — R112 Nausea with vomiting, unspecified: Secondary | ICD-10-CM | POA: Diagnosis not present

## 2018-06-11 DIAGNOSIS — D472 Monoclonal gammopathy: Secondary | ICD-10-CM

## 2018-06-11 DIAGNOSIS — R935 Abnormal findings on diagnostic imaging of other abdominal regions, including retroperitoneum: Secondary | ICD-10-CM

## 2018-06-11 DIAGNOSIS — K632 Fistula of intestine: Secondary | ICD-10-CM | POA: Diagnosis not present

## 2018-06-11 DIAGNOSIS — D61818 Other pancytopenia: Secondary | ICD-10-CM

## 2018-06-11 DIAGNOSIS — D709 Neutropenia, unspecified: Secondary | ICD-10-CM

## 2018-06-15 DIAGNOSIS — C3411 Malignant neoplasm of upper lobe, right bronchus or lung: Secondary | ICD-10-CM

## 2018-06-15 DIAGNOSIS — D6481 Anemia due to antineoplastic chemotherapy: Secondary | ICD-10-CM

## 2018-06-15 DIAGNOSIS — D472 Monoclonal gammopathy: Secondary | ICD-10-CM

## 2018-06-15 DIAGNOSIS — J449 Chronic obstructive pulmonary disease, unspecified: Secondary | ICD-10-CM

## 2018-06-22 DIAGNOSIS — Z0001 Encounter for general adult medical examination with abnormal findings: Secondary | ICD-10-CM

## 2018-06-29 DIAGNOSIS — D709 Neutropenia, unspecified: Secondary | ICD-10-CM

## 2018-06-29 DIAGNOSIS — D696 Thrombocytopenia, unspecified: Secondary | ICD-10-CM | POA: Diagnosis not present

## 2018-06-29 DIAGNOSIS — E1169 Type 2 diabetes mellitus with other specified complication: Secondary | ICD-10-CM | POA: Diagnosis not present

## 2018-06-29 DIAGNOSIS — I361 Nonrheumatic tricuspid (valve) insufficiency: Secondary | ICD-10-CM | POA: Diagnosis not present

## 2018-06-29 DIAGNOSIS — I639 Cerebral infarction, unspecified: Secondary | ICD-10-CM

## 2018-06-30 DIAGNOSIS — E876 Hypokalemia: Secondary | ICD-10-CM

## 2018-06-30 DIAGNOSIS — E1169 Type 2 diabetes mellitus with other specified complication: Secondary | ICD-10-CM | POA: Diagnosis not present

## 2018-06-30 DIAGNOSIS — J449 Chronic obstructive pulmonary disease, unspecified: Secondary | ICD-10-CM

## 2018-06-30 DIAGNOSIS — I639 Cerebral infarction, unspecified: Secondary | ICD-10-CM | POA: Diagnosis not present

## 2018-07-01 DIAGNOSIS — E1169 Type 2 diabetes mellitus with other specified complication: Secondary | ICD-10-CM | POA: Diagnosis not present

## 2018-07-01 DIAGNOSIS — E876 Hypokalemia: Secondary | ICD-10-CM | POA: Diagnosis not present

## 2018-07-01 DIAGNOSIS — J449 Chronic obstructive pulmonary disease, unspecified: Secondary | ICD-10-CM | POA: Diagnosis not present

## 2018-07-01 DIAGNOSIS — I639 Cerebral infarction, unspecified: Secondary | ICD-10-CM | POA: Diagnosis not present

## 2018-07-02 DIAGNOSIS — I639 Cerebral infarction, unspecified: Secondary | ICD-10-CM | POA: Diagnosis not present

## 2018-07-02 DIAGNOSIS — E1169 Type 2 diabetes mellitus with other specified complication: Secondary | ICD-10-CM | POA: Diagnosis not present

## 2018-07-02 DIAGNOSIS — E876 Hypokalemia: Secondary | ICD-10-CM | POA: Diagnosis not present

## 2018-07-02 DIAGNOSIS — J449 Chronic obstructive pulmonary disease, unspecified: Secondary | ICD-10-CM | POA: Diagnosis not present

## 2018-11-05 DIAGNOSIS — J449 Chronic obstructive pulmonary disease, unspecified: Secondary | ICD-10-CM

## 2018-11-05 DIAGNOSIS — G9389 Other specified disorders of brain: Secondary | ICD-10-CM

## 2018-11-05 DIAGNOSIS — R55 Syncope and collapse: Secondary | ICD-10-CM | POA: Diagnosis not present

## 2018-11-05 DIAGNOSIS — I2510 Atherosclerotic heart disease of native coronary artery without angina pectoris: Secondary | ICD-10-CM

## 2018-11-05 DIAGNOSIS — R112 Nausea with vomiting, unspecified: Secondary | ICD-10-CM

## 2018-11-05 DIAGNOSIS — E1169 Type 2 diabetes mellitus with other specified complication: Secondary | ICD-10-CM

## 2018-11-06 DIAGNOSIS — G9389 Other specified disorders of brain: Secondary | ICD-10-CM | POA: Diagnosis not present

## 2018-11-06 DIAGNOSIS — E1169 Type 2 diabetes mellitus with other specified complication: Secondary | ICD-10-CM | POA: Diagnosis not present

## 2018-11-06 DIAGNOSIS — R112 Nausea with vomiting, unspecified: Secondary | ICD-10-CM | POA: Diagnosis not present

## 2018-11-06 DIAGNOSIS — R55 Syncope and collapse: Secondary | ICD-10-CM | POA: Diagnosis not present

## 2018-11-27 DEATH — deceased

## 2020-09-18 IMAGING — DX DG CHEST 2V
2 series · 2 of 2 positions shown · non-contrast
Comparison: CT scan and radiographs April 20, 2018.

CLINICAL DATA: Shortness of breath.

EXAM:
CHEST - 2 VIEW

[x chest ap]
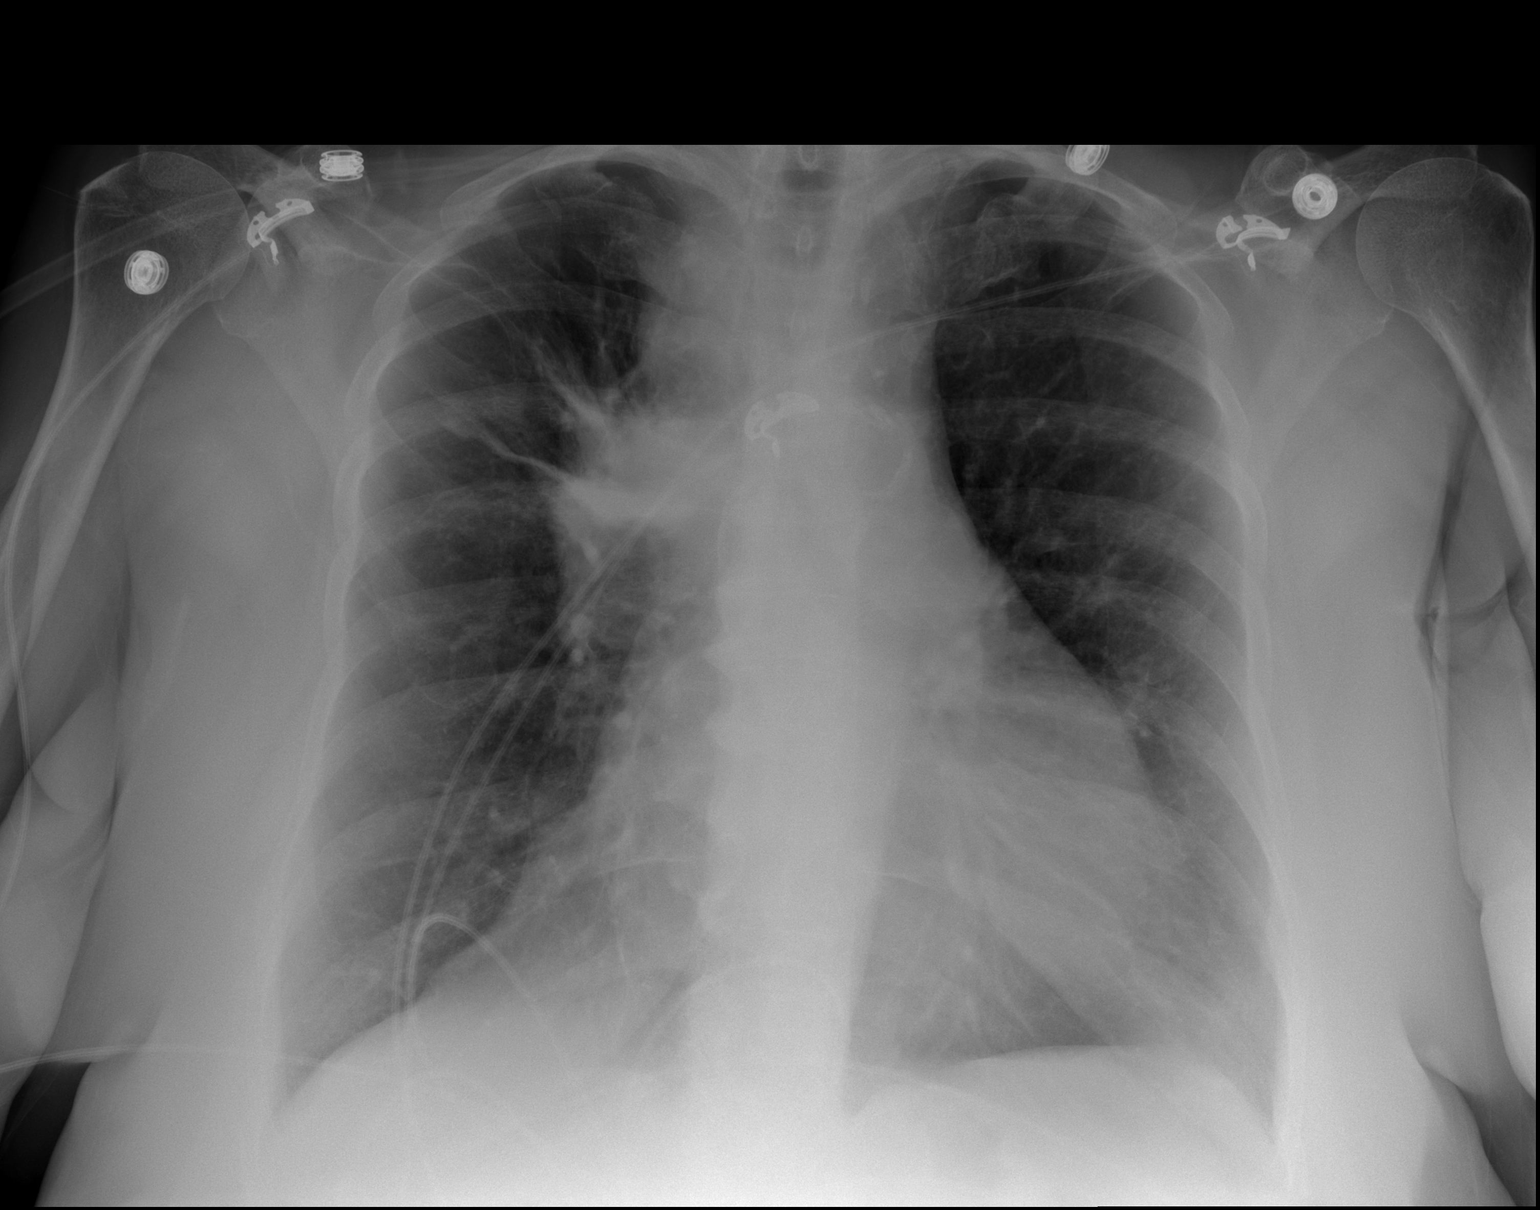

[w chest lat]
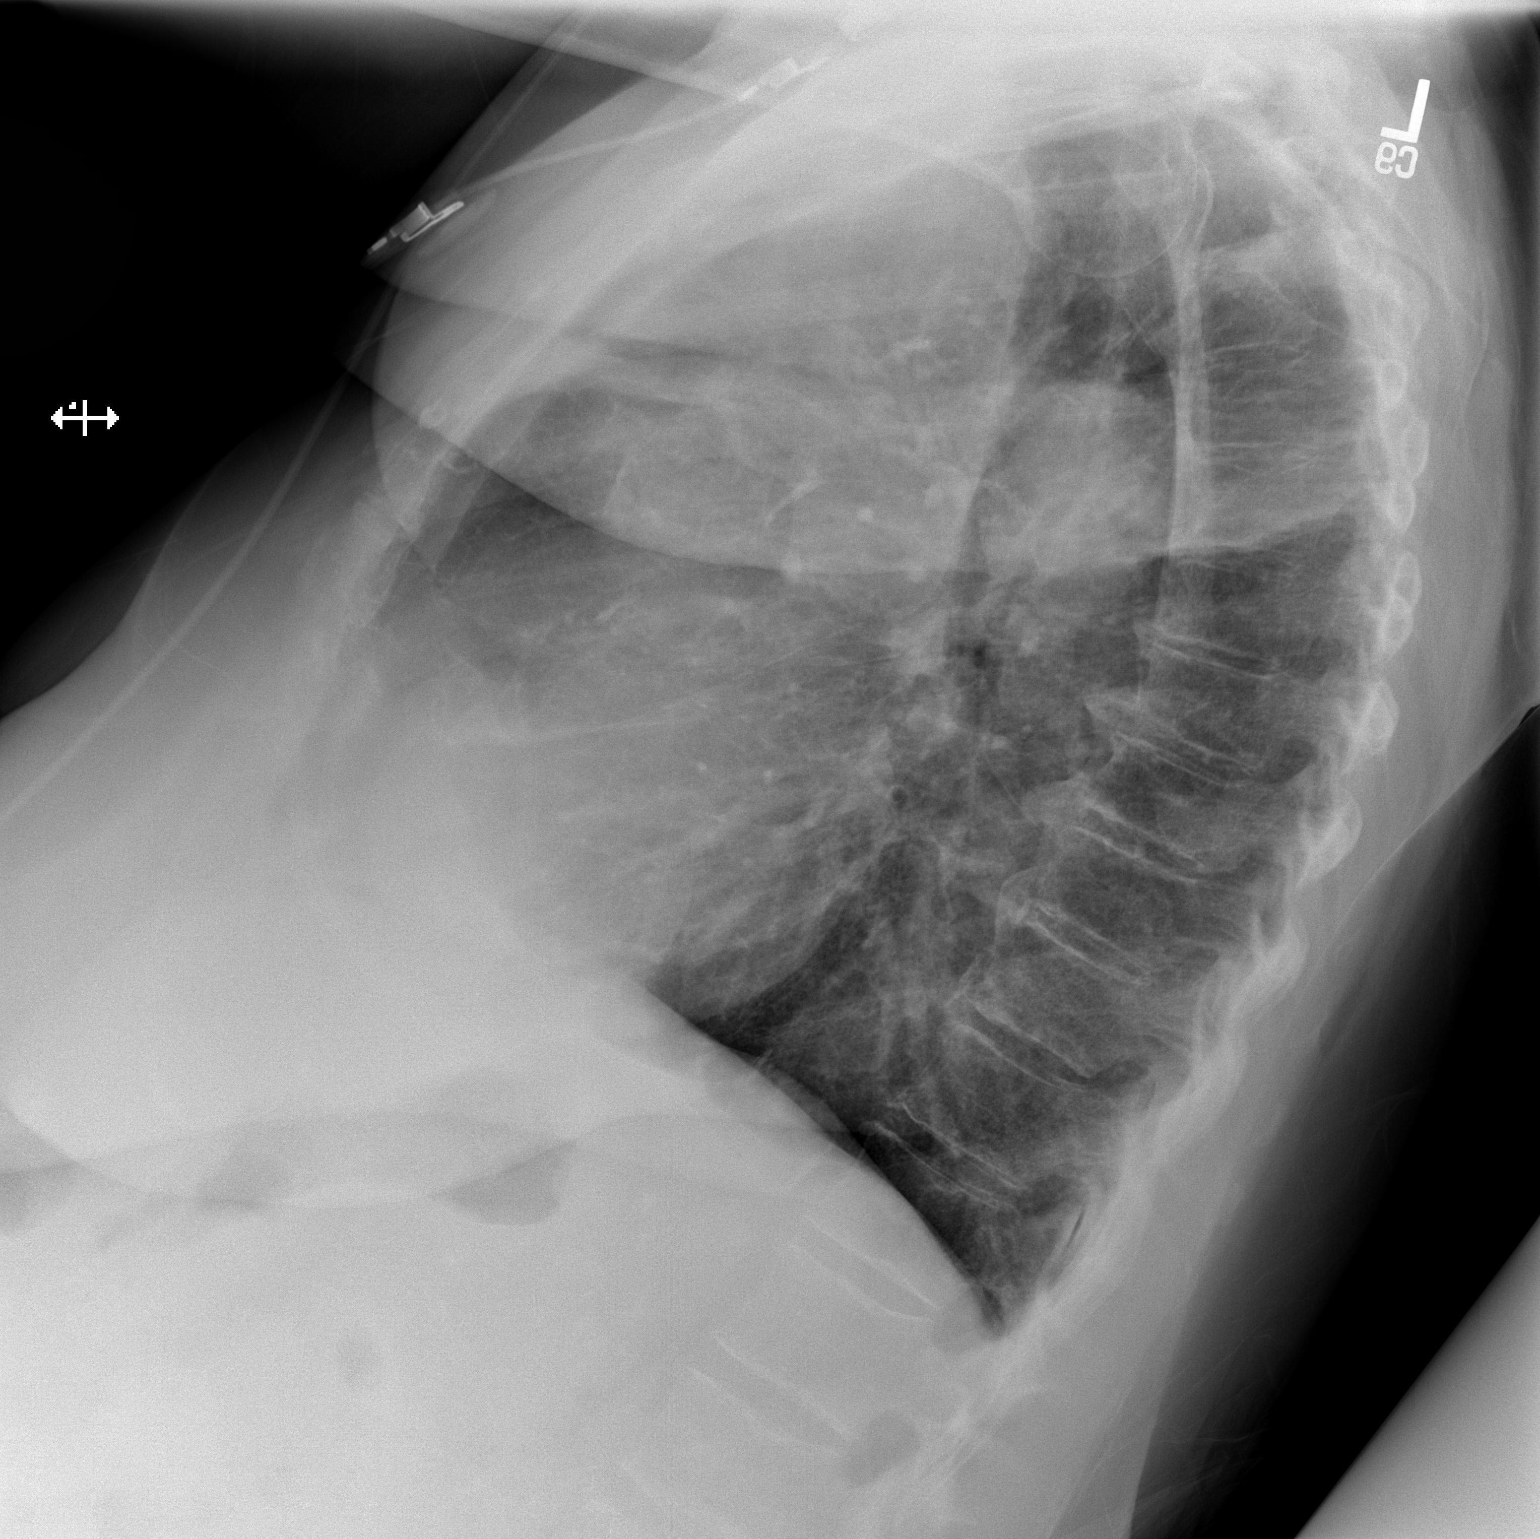

[2 of 2 positions shown; findings below may reference images not displayed]

FINDINGS: Stable cardiomediastinal silhouette. Atherosclerosis of thoracic
aorta is noted. Right suprahilar mass is noted as described on prior
CT scan with associated atelectasis. Left lung is clear. No
pneumothorax or pleural effusion is noted.
IMPRESSION: Stable appearance of right suprahilar mass and associated
subsegmental atelectasis as described on prior CT scan. No
significant change compared to prior exam.

Aortic Atherosclerosis ([id number]).
